# Patient Record
Sex: Female | Born: 1950
Health system: Southern US, Community
[De-identification: ages and names within clinical notes are randomized; demographics above are authoritative.]

## PROBLEM LIST (undated history)

## (undated) DIAGNOSIS — D219 Benign neoplasm of connective and other soft tissue, unspecified: Secondary | ICD-10-CM

## (undated) DIAGNOSIS — M1990 Unspecified osteoarthritis, unspecified site: Secondary | ICD-10-CM

## (undated) DIAGNOSIS — R112 Nausea with vomiting, unspecified: Secondary | ICD-10-CM

## (undated) DIAGNOSIS — E785 Hyperlipidemia, unspecified: Secondary | ICD-10-CM

## (undated) DIAGNOSIS — L719 Rosacea, unspecified: Secondary | ICD-10-CM

## (undated) DIAGNOSIS — Z9189 Other specified personal risk factors, not elsewhere classified: Secondary | ICD-10-CM

## (undated) DIAGNOSIS — K219 Gastro-esophageal reflux disease without esophagitis: Secondary | ICD-10-CM

## (undated) DIAGNOSIS — I809 Phlebitis and thrombophlebitis of unspecified site: Secondary | ICD-10-CM

## (undated) DIAGNOSIS — E041 Nontoxic single thyroid nodule: Secondary | ICD-10-CM

## (undated) DIAGNOSIS — Z9889 Other specified postprocedural states: Secondary | ICD-10-CM

## (undated) HISTORY — DX: Nontoxic single thyroid nodule: E04.1

## (undated) HISTORY — DX: Phlebitis and thrombophlebitis of unspecified site: I80.9

## (undated) HISTORY — DX: Gastro-esophageal reflux disease without esophagitis: K21.9

## (undated) HISTORY — PX: BREAST EXCISIONAL BIOPSY: SUR124

## (undated) HISTORY — DX: Hyperlipidemia, unspecified: E78.5

## (undated) HISTORY — PX: TONSILLECTOMY AND ADENOIDECTOMY: SUR1326

## (undated) HISTORY — PX: FOOT SURGERY: SHX648

## (undated) HISTORY — DX: Other specified personal risk factors, not elsewhere classified: Z91.89

## (undated) HISTORY — DX: Benign neoplasm of connective and other soft tissue, unspecified: D21.9

## (undated) HISTORY — DX: Rosacea, unspecified: L71.9

---

## 1992-02-20 HISTORY — PX: DILATION AND CURETTAGE OF UTERUS: SHX78

## 1998-09-22 ENCOUNTER — Other Ambulatory Visit: Admission: RE | Admit: 1998-09-22 | Discharge: 1998-09-22 | Payer: Self-pay | Admitting: Obstetrics and Gynecology

## 1999-10-17 ENCOUNTER — Other Ambulatory Visit: Admission: RE | Admit: 1999-10-17 | Discharge: 1999-10-17 | Payer: Self-pay | Admitting: Radiology

## 1999-10-27 ENCOUNTER — Other Ambulatory Visit: Admission: RE | Admit: 1999-10-27 | Discharge: 1999-10-27 | Payer: Self-pay | Admitting: Obstetrics and Gynecology

## 2000-09-06 ENCOUNTER — Other Ambulatory Visit: Admission: RE | Admit: 2000-09-06 | Discharge: 2000-09-06 | Payer: Self-pay | Admitting: Obstetrics and Gynecology

## 2000-11-19 DIAGNOSIS — I809 Phlebitis and thrombophlebitis of unspecified site: Secondary | ICD-10-CM

## 2000-11-19 HISTORY — DX: Phlebitis and thrombophlebitis of unspecified site: I80.9

## 2000-12-18 ENCOUNTER — Ambulatory Visit (HOSPITAL_COMMUNITY): Admission: RE | Admit: 2000-12-18 | Discharge: 2000-12-18 | Payer: Self-pay | Admitting: Family Medicine

## 2001-09-11 ENCOUNTER — Other Ambulatory Visit: Admission: RE | Admit: 2001-09-11 | Discharge: 2001-09-11 | Payer: Self-pay | Admitting: Obstetrics and Gynecology

## 2002-01-12 ENCOUNTER — Encounter (INDEPENDENT_AMBULATORY_CARE_PROVIDER_SITE_OTHER): Payer: Self-pay | Admitting: Specialist

## 2002-01-12 ENCOUNTER — Ambulatory Visit (HOSPITAL_COMMUNITY): Admission: RE | Admit: 2002-01-12 | Discharge: 2002-01-12 | Payer: Self-pay | Admitting: Obstetrics and Gynecology

## 2002-03-12 ENCOUNTER — Ambulatory Visit (HOSPITAL_COMMUNITY): Admission: RE | Admit: 2002-03-12 | Discharge: 2002-03-12 | Payer: Self-pay | Admitting: Gastroenterology

## 2002-09-20 DIAGNOSIS — E041 Nontoxic single thyroid nodule: Secondary | ICD-10-CM

## 2002-09-20 HISTORY — DX: Nontoxic single thyroid nodule: E04.1

## 2002-09-24 ENCOUNTER — Other Ambulatory Visit: Admission: RE | Admit: 2002-09-24 | Discharge: 2002-09-24 | Payer: Self-pay | Admitting: Obstetrics and Gynecology

## 2002-12-02 ENCOUNTER — Other Ambulatory Visit: Admission: RE | Admit: 2002-12-02 | Discharge: 2002-12-02 | Payer: Self-pay | Admitting: Diagnostic Radiology

## 2003-02-20 HISTORY — PX: THYROID LOBECTOMY: SHX420

## 2003-03-05 ENCOUNTER — Observation Stay (HOSPITAL_COMMUNITY): Admission: RE | Admit: 2003-03-05 | Discharge: 2003-03-06 | Payer: Self-pay | Admitting: Surgery

## 2003-03-05 ENCOUNTER — Encounter (INDEPENDENT_AMBULATORY_CARE_PROVIDER_SITE_OTHER): Payer: Self-pay | Admitting: *Deleted

## 2003-04-29 ENCOUNTER — Other Ambulatory Visit: Admission: RE | Admit: 2003-04-29 | Discharge: 2003-04-29 | Payer: Self-pay | Admitting: Obstetrics and Gynecology

## 2003-10-04 ENCOUNTER — Other Ambulatory Visit: Admission: RE | Admit: 2003-10-04 | Discharge: 2003-10-04 | Payer: Self-pay | Admitting: Obstetrics and Gynecology

## 2004-04-17 ENCOUNTER — Other Ambulatory Visit: Admission: RE | Admit: 2004-04-17 | Discharge: 2004-04-17 | Payer: Self-pay | Admitting: Obstetrics and Gynecology

## 2004-11-20 ENCOUNTER — Other Ambulatory Visit: Admission: RE | Admit: 2004-11-20 | Discharge: 2004-11-20 | Payer: Self-pay | Admitting: Obstetrics and Gynecology

## 2005-11-23 ENCOUNTER — Other Ambulatory Visit: Admission: RE | Admit: 2005-11-23 | Discharge: 2005-11-23 | Payer: Self-pay | Admitting: Obstetrics and Gynecology

## 2006-01-16 ENCOUNTER — Encounter: Admission: RE | Admit: 2006-01-16 | Discharge: 2006-01-16 | Payer: Self-pay | Admitting: Family Medicine

## 2006-02-11 ENCOUNTER — Encounter: Admission: RE | Admit: 2006-02-11 | Discharge: 2006-02-11 | Payer: Self-pay | Admitting: Family Medicine

## 2006-03-27 ENCOUNTER — Encounter: Admission: RE | Admit: 2006-03-27 | Discharge: 2006-03-27 | Payer: Self-pay | Admitting: Family Medicine

## 2006-05-02 ENCOUNTER — Ambulatory Visit (HOSPITAL_BASED_OUTPATIENT_CLINIC_OR_DEPARTMENT_OTHER): Admission: RE | Admit: 2006-05-02 | Discharge: 2006-05-02 | Payer: Self-pay | Admitting: Orthopedic Surgery

## 2006-12-23 ENCOUNTER — Encounter: Admission: RE | Admit: 2006-12-23 | Discharge: 2006-12-23 | Payer: Self-pay | Admitting: Family Medicine

## 2007-01-01 ENCOUNTER — Encounter: Admission: RE | Admit: 2007-01-01 | Discharge: 2007-01-01 | Payer: Self-pay | Admitting: Family Medicine

## 2007-01-07 ENCOUNTER — Ambulatory Visit: Payer: Self-pay | Admitting: Vascular Surgery

## 2007-01-14 ENCOUNTER — Other Ambulatory Visit: Admission: RE | Admit: 2007-01-14 | Discharge: 2007-01-14 | Payer: Self-pay | Admitting: Obstetrics and Gynecology

## 2007-04-14 ENCOUNTER — Ambulatory Visit: Payer: Self-pay | Admitting: Vascular Surgery

## 2007-04-22 ENCOUNTER — Ambulatory Visit: Payer: Self-pay | Admitting: Vascular Surgery

## 2007-05-26 ENCOUNTER — Ambulatory Visit: Payer: Self-pay | Admitting: Vascular Surgery

## 2007-06-03 ENCOUNTER — Ambulatory Visit: Payer: Self-pay | Admitting: Vascular Surgery

## 2007-06-05 ENCOUNTER — Other Ambulatory Visit: Admission: RE | Admit: 2007-06-05 | Discharge: 2007-06-05 | Payer: Self-pay | Admitting: Obstetrics and Gynecology

## 2007-06-24 ENCOUNTER — Encounter: Admission: RE | Admit: 2007-06-24 | Discharge: 2007-06-24 | Payer: Self-pay | Admitting: Family Medicine

## 2007-07-09 ENCOUNTER — Encounter: Admission: RE | Admit: 2007-07-09 | Discharge: 2007-07-09 | Payer: Self-pay | Admitting: Obstetrics and Gynecology

## 2007-09-16 ENCOUNTER — Other Ambulatory Visit: Admission: RE | Admit: 2007-09-16 | Discharge: 2007-09-16 | Payer: Self-pay | Admitting: Obstetrics and Gynecology

## 2007-12-16 ENCOUNTER — Ambulatory Visit: Payer: Self-pay | Admitting: Vascular Surgery

## 2008-01-14 ENCOUNTER — Encounter: Admission: RE | Admit: 2008-01-14 | Discharge: 2008-01-14 | Payer: Self-pay | Admitting: Obstetrics and Gynecology

## 2008-01-21 ENCOUNTER — Other Ambulatory Visit: Admission: RE | Admit: 2008-01-21 | Discharge: 2008-01-21 | Payer: Self-pay | Admitting: Obstetrics and Gynecology

## 2008-01-29 ENCOUNTER — Encounter: Admission: RE | Admit: 2008-01-29 | Discharge: 2008-01-29 | Payer: Self-pay | Admitting: Obstetrics and Gynecology

## 2008-08-09 ENCOUNTER — Encounter: Admission: RE | Admit: 2008-08-09 | Discharge: 2008-08-09 | Payer: Self-pay | Admitting: Obstetrics and Gynecology

## 2009-01-14 ENCOUNTER — Encounter: Admission: RE | Admit: 2009-01-14 | Discharge: 2009-01-14 | Payer: Self-pay | Admitting: Obstetrics and Gynecology

## 2010-01-16 ENCOUNTER — Encounter: Admission: RE | Admit: 2010-01-16 | Discharge: 2010-01-16 | Payer: Self-pay | Admitting: Family Medicine

## 2010-02-19 HISTORY — PX: KNEE SURGERY: SHX244

## 2010-03-12 ENCOUNTER — Encounter: Payer: Self-pay | Admitting: Family Medicine

## 2010-07-04 NOTE — Assessment & Plan Note (Signed)
OFFICE VISIT   VONA, Christie Beasley  DOB:  1950-04-27                                       12/16/2007  [id number]#:[id number]   The patient is being seen today for a 61-month follow up regarding her  bilateral great saphenous vein ablations with multiple stab  phlebectomies for painful varicosities.  The right side was done  initially in February, followed by the left side in April 2009.  She had  1880 joules of energy on the right side and 2115 joules of energy on the  left.  She, at this point, is not having any specific symptoms related  to her left thigh or calf.  She is very pleased with her result.  The  painful varicosities are absent and she is having no distal edema.  She  has not worn elastic compression stockings on a regular basis and states  that her preoperative symptoms have been relieved at the present time.   PHYSICAL EXAM:  Blood pressure 140/85, heart rate 64, respirations 14.  She has excellent femoral, popliteal, and dorsalis pedis pulses  bilaterally.  There is no distal edema.  There is no evidence of any  residual varicosities.  No hyperpigmentation.  No ulceration.  No  evidence of ischemia.   Venous duplex exam was performed today and, interestingly, the right  greater saphenous vein is closed up to the saphenofemoral junction, but  the left great saphenous vein is widely patent with reflux at the left  saphenofemoral junction and throughout the left greater saphenous vein.  We reviewed the photographs from the 1-week post ablation venous duplex,  which shows clearly that the vein was noncompressible at that time on  the left side, so it can certainly be cannulized throughout.  Currently,  it is asymptomatic and there are no recurrent varicosities.  She may  well require reclosure of the left greater saphenous vein in the future  if she should develop more symptoms or recurrent varicosities.  She will  watch this closely and, if this  occurs, get back in touch with [place].  Otherwise, return to see [place] on a p.r.n. basis.   Quita Skye Hart [place], M.D.  Electronically Signed   JDL/MEDQ  D:  12/16/2007  T:  12/17/2007  Job:  [id number]

## 2010-07-04 NOTE — Consult Note (Signed)
VASCULAR SURGERY CONSULTATION   Christie Beasley, Christie Beasley  DOB:  03/31/1950                                       01/07/2007  [id number]#:[id number]   This is a second opinion.   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  She notices significant throbbing, aching, burning, and  itching in both legs particularly in the thigh and calf regions which  progress as the day progresses, and she is on her feet most of the day  teaching.  She has had no history of venostasis ulcers or bleeding but  does continue to have severe symptoms despite wearing graduated  compression stockings (long leg) since October 08, 2006, when she was  initially evaluated by Dr. Ardyth Gal.  Her symptoms have continued.  She does have some improvement with elevation of the legs which she is  unable to do during the day as she teaches.  Her symptoms are affecting  her daily living both at home and at work, and this is not improved with  the conservative measures which have included graduated compression  stockings as well as analgesics.   PAST MEDICAL HISTORY:  Negative for diabetes, hypertension, coronary  artery disease, COPD, or stroke.  She does have a history of thyroid  disease, has had thyroid surgery, and is on Synthroid.   PREVIOUS SURGERY:  Thyroid resection, breast biopsies, cesarean section  x2.   FAMILY HISTORY:  Positive for coronary artery disease in her mother,  stroke in her father, and negative for diabetes.   SOCIAL HISTORY:  She is single and has 2 children.  Works as a Runner, broadcasting/film/video  at [employer].  She does not use tobacco on a regular basis.  Drinks occasional alcohol.   REVIEW OF SYSTEMS:  Please see health history form.   MEDICATIONS:  Please see health history form.   PHYSICAL EXAMINATION:  VITAL SIGNS:  Blood pressure 128/78, heart rate  64, respirations 18.  GENERAL:   She is a healthy-appearing, middle-aged female in no apparent  distress.  She is alert and oriented x3.  NECK:  Supple.  3+ carotid pulses palpable.  No bruits are audible.  No  palpable adenopathy in the neck.  NEUROLOGIC:  Normal.  CHEST:  Clear to auscultation.  CARDIOVASCULAR:  Regular rhythm with no murmurs.  ABDOMEN:  Soft and nontender with no palpable masses.  EXTREMITIES:  Upper extremity pulses 3+ bilaterally.  Lower extremity  exam reveals 3+ femoral, popliteal, and dorsalis pedis pulses palpable.  She has severe greater saphenous varicosities bilaterally with the right  worse than the left in the distal thigh and medial calf areas with mild  edema distally.  She has some spider and reticular veins near the ankles  bilaterally.  She has some mild venostasis dermatitis bilaterally.  There is no active ulcers.   Venous duplex exam performed at [facility] revealed reflux in both  greater saphenous veins from the saphenofemoral junction distally to the  knee as well as reflux in both short saphenous veins.   I feel that this patient is having significant symptomatology and would  benefit from laser ablation of her right greater saphenous vein with  multiple stab phlebectomies performed at the same setting.  She should  also have laser ablation  of her left greater saphenous system with stab  phlebectomies at a later date to relieve the significant symptoms that  she is experiencing which affect her daily living and her ability to  work.   Quita Skye Hart [place], M.D.  Electronically Signed  JDL/MEDQ  D:  01/07/2007  T:  01/08/2007  Job:  [id number]   cc:   Fayne Norrie, M.D.  Quita Skye Artis Flock, M.D.

## 2010-07-04 NOTE — Assessment & Plan Note (Signed)
OFFICE VISIT   Christie Beasley, Christie Beasley  DOB:  July 23, 1950                                       06/03/2007  EAVWU#:[id number]   The patient underwent laser ablation of left greater saphenous vein with  multiple stab phlebectomies on April 6.  She has had some mild  discomfort in the distal thigh where the injury site was located for the  saphenous vein ablation, and some mild tenderness along the course of  the greater saphenous vein as one would expect, but that is resolving  rapidly.  She has had no distal edema or discomfort in the left calf, or  discomfort related to the stab phlebectomy wounds.   EXAM:  Blood pressure 127/79, heart rate is 55.  Her ankle is not  swollen on the left side and the stab phlebectomy wounds are all healing  well.  There is a moderate area of ecchymosis in the distal thigh  medially over the greater saphenous vein.  There is some mild tenderness  to palpation.  I performed a limited venous duplex exam today and the  saphenous vein is totally occluded from 2 cm from the saphenofemoral  junction to the knee.  Deep venous system is widely patent with normal-  appearing flow.  She is reassured regarding these findings.  She will  schedule some sclerotherapy in the near future with Marisue Ivan, and will return  in 6 months for final followup of bilateral venous reflux study at the  time of her return visit.   Quita Skye Hart [place], M.D.  Electronically Signed   JDL/MEDQ  D:  06/03/2007  T:  06/04/2007  Job:  [id number]

## 2010-07-04 NOTE — Procedures (Signed)
DUPLEX DEEP VENOUS EXAM - LOWER EXTREMITY   INDICATION:  Follow up bilateral greater saphenous vein ablation.   HISTORY:  Edema:  No.  Trauma/Surgery:  Yes.  Pain:  No.  PE:  No.  Previous DVT:  No.  Anticoagulants:  Other:   DUPLEX EXAM:                CFV   SFV   PopV  PTV    GSV                R  L  R  L  R  L  R   L  R  L  Thrombosis    o  o  o  o  o  o  o   o  +  Spontaneous   +  +  +  +  +  +  +   +  +  +  Phasic        +  +  +  +  +  +  +   +  +  +  Augmentation  +  +  +  +  +  +  +   +  +  +  Compressible  +  +  +  +  +  +  +   +  +  +  Competent     +  +  +  +  +  +  +   +  +  +   Legend:  + - yes  o - no  p - partial  D - decreased   IMPRESSION:  No evidence of bilateral lower extremity deep venous  thrombosis.  The left greater saphenous vein appears patent with reflux from SFJ to  distal thigh.  The right greater saphenous vein appears ablated from proximal thigh to  distal thigh.   _____________________________  Quita Skye. Hart [place], M.D.   MG/MEDQ  D:  12/16/2007  T:  12/16/2007  Job:  [id number]

## 2010-07-04 NOTE — Assessment & Plan Note (Signed)
OFFICE VISIT   TIANNE, PLOTT  DOB:  1950-04-23                                       04/22/2007  EAVWU#:[id number]   Patient returns one week post laser ablation of her right greater  saphenous vein with multiple stab phlebectomies in the right calf and  thigh.  She has had mild-to-moderate discomfort along the course of the  greater saphenous vein in the thigh, where the ablation was performed,  and some mild discomfort in the medial calf.  She has had some bruising  in the distal thigh, which has not been severe, and her pain has  improved each day.  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  She has had no distal edema.   PHYSICAL EXAMINATION:  Blood pressure 139/86, heart rate 76.  The small  area of ecchymosis in the distal medial thigh is mildly tender.  There  is no distal edema noted.  Stab phlebectomy wounds are healing well.   I performed a venous duplex exam today, and there is total occlusion of  the saphenous vein from 1 cm from the saphenofemoral junction to the  knee with a totally noncompressible vein and no flow visible.  The deep  venous system appears widely patent with no thrombus in the common  femoral vein and normal flow.   She was reassured regarding these findings and will be scheduled for a  similar procedure on her left leg in the near future.   Quita Skye Hart [place], M.D.  Electronically Signed   JDL/MEDQ  D:  04/22/2007  T:  04/23/2007  Job:  [phone]

## 2010-07-07 NOTE — Op Note (Signed)
NAME:  Christie Beasley, Christie Beasley                        ACCOUNT NO.:  [id number]   MEDICAL RECORD NO.:  [id number]                   PATIENT TYPE:  AMB   LOCATION:  ENDO                                 FACILITY:  [facility]   PHYSICIAN:  Anselmo Rod, M.D.               DATE OF BIRTH:  [birth year]   DATE OF PROCEDURE:  03/12/2002  DATE OF DISCHARGE:                                 OPERATIVE REPORT   PROCEDURE PERFORMED:  Screening colonoscopy.   ENDOSCOPIST:  Anselmo Rod, M.D.   INSTRUMENT USED:  Olympus video colonoscope.   INDICATIONS FOR PROCEDURE:  A 60 year old white female who underwent  screening colonoscopy to rule out colonic polyps, masses, etc.   PREPROCEDURE PREPARATION:  Informed consent was procured from the patient.  The patient had fasted for eight hours prior to the procedure and prepped  with a bottle of magnesium citrate and a bottle of MiraLax the night prior  to the procedure.   PREPROCEDURE PHYSICAL:  VITAL SIGNS:  The patient has stable vital signs.  NECK:  Supple.  CHEST:  Clear to auscultation, S1, S2 regular.  ABDOMEN:  Soft with normal bowel sounds.   DESCRIPTION OF PROCEDURE:  The patient was placed in the left lateral  decubitus position and sedated with 100 mg of Demerol and 10 mg of Versed  intravenously.  Once the patient was adequately sedated and maintained on  low-flow oxygen and continuous cardiac monitoring, the Olympus video  colonoscope was advanced from the rectum to the cecum and terminal ileum  with extreme difficulty.  The patient had an atonic colon.  The colon was  very tortuous.  The patient's position had to be changed from the left  lateral position to the right lateral position on several occasions with the  application of abdominal pressure to facilitate advancement of the scope up  to the cecum.  The appendiceal orifice and ileocecal valve were visualized  and photographed.  The terminal ileum appeared normal and without lesions.  No masses, polyps, diverticula, erosions, or ulcerations were seen.  Retroflexion revealed no acute abnormalities.   IMPRESSION:  1. Healthy-appearing colonic mucosa and terminal ileum.  2. Very tortuous colon and terminal ileum.  3. Prolonged procedure secondary to atonic colon.   RECOMMENDATIONS:  1. High-fiber diet with liberal fluid intake has been recommended.  2.     Repeat CRC screening is recommended in the next 10 years unless the patient      develops any abnormal symptoms in the interim.  3. Outpatient follow-up on an outpatient basis.                                                Anselmo Rod, M.D.    JNM/MEDQ  D:  03/12/2002  T:  03/12/2002  Job:  [id number]   cc:   Edwena Felty. Romine, M.D.  [address]  [place]  [place] [address]  Fax: [phone]

## 2010-07-07 NOTE — Op Note (Signed)
Christie Beasley, Christie Beasley              ACCOUNT NO.:  [id number]   MEDICAL RECORD NO.:  [id number]          PATIENT TYPE:  AMB   LOCATION:  DSC                          FACILITY:  [facility]   PHYSICIAN:  Rodney A. Mortenson, M.D.DATE OF BIRTH:  02-Oct-1950   DATE OF PROCEDURE:  05/02/2006  DATE OF DISCHARGE:                               OPERATIVE REPORT   JUSTIFICATION:  A 60 year old female with a 2-year history of bilateral  knee pain worse on the right than on the left.  She has crunching in her  knees.  There is no joint line tenderness but the patella in the  intercondylar notch is very uncomfortable.  Standard weightbearing films  show patellofemoral arthritic changes, greater on the right than on the  left.  There are large marginal osteophytes about the border of the  patella and some lateral tracking.  The joint space about the lateral  patellofemoral joint is narrowed.  There is some early osteoarthritis of  the medial compartment on weightbearing films.  Because of persistent  pain and discomfort, it is felt that arthroscopic evaluation and  treatment is indicated.  The case was reviewed with Dr. Priscille Kluver.  It was  felt that since the lateral tracking that a lateral release and  debridement of the osteophytes off the lateral patellar facet made help  decompress the lateral patellar facet and hopefully give some pain  relief.  The patient clearly understands the intent of this operation.  No guarantees can be given and she clearly understands.  Questions were  answered and encouraged.  Complications were discussed preoperatively.  At the time of surgery her other pathology will be visualized and  addressed.   JUSTIFICATION FOR OUTPATIENT SURGERY:  Minimal morbidity.   PREOPERATIVE DIAGNOSES:  1. 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  2. Osteophytes, lateral patellar facet.  3. Small radial tear at the medial meniscus, right knee.   POSTOPERATIVE DIAGNOSES:  1.  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  2. Osteophytes, lateral patellar facet.  3. Small radial tear at the medial meniscus, right knee.   OPERATION:  1. Arthroscopy.  2. Debridement at the leading edge of the medial meniscus, right knee.  3. Removal of osteophytes over the lateral border, lateral patellar      facet.  4. Lateral release, right knee.   SURGEON:  Lenard Galloway. Chaney Malling, M.D.   ANESTHESIA:  MAC.   PATHOLOGY:  With the arthroscope in the knee, a very careful examination  of the knee was undertaken.  The patellofemoral joint was visualized  first.  There was a large about the lateral patellar facet that has  absolutely no articular cartilage and there is a line of large  osteophytes about the lateral patellar border.  The patella tilts  laterally.  The medial side of the patella appears fairly normal.  The  ACL was normal.  In the medial compartment there was fairly normal  articular cartilage over the medial femoral condyle and medial tibial  plateau, although the be somewhat thinned, and there is a small radial  tear in the mid third of  the medial meniscus.  In the lateral  compartment, articular cartilage about the weightbearing area of the  lateral femoral condyle and lateral tibial plateau was normal, as is the  entire lateral meniscus.  The trochlear area shows some chondromalacia  of the patella.   PROCEDURE:  The patient was placed on the operating table in supine  position with a pneumatic tourniquet about the right upper thigh.  The  right leg was placed in a leg holder and the entire right lower  extremity prepped with DuraPrep, draped out in the usual manner.  An  infusion cannula was placed in the superior medial pouch and the knee  distended with saline.  Anteromedial and anterolateral portals were made  and the arthroscope was introduced.  Attention was first turned to the  medial compartment.  There was some fraying and tearing of the  leading  edge the mid third of the medial meniscus and it was debrided with intra-  articular shaver.  The rest of the meniscus appeared fairly normal and  stable.  There is some early loss of articular cartilage over the  weightbearing area of the medial femoral condyle.  Again the lateral  compartment appeared fairly normal.  The trochlear area had some  chondromalacia and the patella showed total loss of articular cartilage  over the lateral patellar facet and a line of osteophytes laterally.  Through the lateral port a 4-mm bur was inserted and the osteophytes  along the lateral border of the patella were debrided back.  Once this  was accomplished to my satisfaction, a lateral release was done to allow  a patella to shift more to the midline and unload the lateral patellar  facet.  Excellent decompression of the lateral retinacular structures  was achieved, allowing the patella to track more to the midline.  Bleeders were coagulated.  The knee was put through a full range of  motion.  Marcaine was then placed in the knee and a large bulky pressure  dressing applied and the patient returned to the recovery room in  excellent condition.  Technically this went extremely well.   FOLLOW-UP CARE:  1. Percocet for pain.  2. Usual postoperative instructions given.  3. To my office on Wednesday.  4. In addition, in the future this patient will be a total knee      candidate but, hopefully, this will give her more mileage in the      short term and that was our objective.           ______________________________  Lenard Galloway. Chaney Malling, M.D.     RAM/MEDQ  D:  05/02/2006  T:  05/03/2006  Job:  [id number]

## 2010-07-07 NOTE — Op Note (Signed)
   NAME:  Christie Beasley, Christie Beasley                        ACCOUNT NO.:  [id number]   MEDICAL RECORD NO.:  [id number]                   PATIENT TYPE:  AMB   LOCATION:  SDC                                  FACILITY:  [facility]   PHYSICIAN:  Cynthia P. Romine, M.D.             DATE OF BIRTH:  [birth year]   DATE OF PROCEDURE:  01/12/2002  DATE OF DISCHARGE:                                 OPERATIVE REPORT   PREOPERATIVE DIAGNOSES:  Postcoital, postmenopausal bleeding.   POSTOPERATIVE DIAGNOSES:  Postcoital, postmenopausal bleeding, pathology  pending.   PROCEDURE:  1. Hysteroscopy.  2. Dilatation and curettage.   SURGEON:  Cynthia P. Romine, M.D.   ANESTHESIA:  General by LMA.   ESTIMATED BLOOD LOSS:  50 cc.   COMPLICATIONS:  None.   PROCEDURE:  The patient was taken to the operating room and after the  induction of adequate general anesthesia by LMA was placed in a dorsal  lithotomy position and prepped and draped in the usual fashion.  The cervix  was grasped on its anterior lip with a single tooth tenaculum and dilated to  a number 27 Pratt.  The uterus had previously been sounded to 8 cm.  The  diagnostic hysteroscope was introduced.  Sorbitol was used as a distention  medium at a pressure of 80 mmHg.  The endometrial cavity did appear clean.  The tubal ostia were noted.  No abnormalities were seen with the  hysteroscope.  The hysteroscope was then removed.  A gentle sharp curettage  was then carried out.  The hysteroscope was reintroduced.  There was a loose  piece of tissue noted in the endometrium.  This was grasped with a polyp  forceps.  Finally, it was able to be removed.  Specimen was sent to  pathology.  The instruments were removed from the vagina.  The patient  tolerated it well.  Went in satisfactory condition to postanesthesia  recovery.  The sorbitol deficit was 80 cc.  There were no complications.                                               Cynthia P. Romine, M.D.    CPR/MEDQ  D:  01/12/2002  T:  01/12/2002  Job:  [id number]

## 2010-07-07 NOTE — Op Note (Signed)
NAME:  Christie Beasley, Christie Beasley                        ACCOUNT NO.:  [id number]   MEDICAL RECORD NO.:  [id number]                   PATIENT TYPE:  OBV   LOCATION:  0447                                 FACILITY:  [facility]   PHYSICIAN:  Velora Heckler, M.D.                DATE OF BIRTH:  May 22, 1950   DATE OF PROCEDURE:  03/05/2003  DATE OF DISCHARGE:                                 OPERATIVE REPORT   PREOPERATIVE DIAGNOSIS:  Right thyroid nodule with Hurthle cell change.   POSTOPERATIVE DIAGNOSIS:  Right thyroid nodule with Hurthle cell change.   PROCEDURE:  Right thyroid lobectomy.   SURGEON:  Velora Heckler, M.D.   ASSISTANT:  Jerelene Redden, M.D.   ANESTHESIA:  General.   ESTIMATED BLOOD LOSS:  Minimal.   PREPARATION:  Betadine.   COMPLICATIONS:  None.   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 Ardyth Harps for thyroid nodule.  The patient had  been noted on routine physical exam by her gynecologist to have a right-  sided thyroid nodule.  Ultrasound showed multiple nodules with a dominant  nodule in the right lobe.  Fine needle aspiration was performed.  It showed  Hurthle cell change.  The patient now comes to surgery for a resection.   DESCRIPTION OF PROCEDURE:  The procedure is done in OR #6 at the [facility].  The patient is brought to the operating room and placed  in a supine position on the operating room table.  Following the  administration of general anesthesia, the patient is prepped and draped in  the usual strict aseptic fashion.  After ascertaining that an adequate level  of anesthesia had been obtained, a Kocher incision is made with a #15 blade.  Dissection is carried down through the subcutaneous tissues and platysma.  Hemostasis is obtained with the electrocautery.  Skin flaps are developed  cephalad and caudad from the thyroid notch.  A Mahorner self-retaining  retractor is placed for  exposure.  Strap muscles are incised in the midline.  External jugular veins are ligated with 2-0 silk ties.  Strap muscles are  reflected laterally, and dissection is begun on the right side.  Right  thyroid lobe is carefully dissected out.  Middle thyroid vein is divided  between medium Ligaclips.  The inferior thyroid veins are divided between  medium Ligaclips.  Superior pole is carefully dissected out.  The superior  pole vessels are isolated, ligated in continuity with 2-0 silk ties and  medium Ligaclips and divided.  Gland is rolled anteriorly.  Superior  parathyroid gland is identified.  It is dissected off of the thyroid capsule  and preserved.  Inferior parathyroid gland is likewise identified, dissected  off the thyroid capsule, and preserved.  Branches of the inferior thyroid  artery are divided between small Ligaclips.  Recurrent laryngeal nerve is  identified and  preserved.  Ligament of Allyson Sabal is carefully transected with  the electrocautery, and the gland is rolled further medially.  It is rolled  up and onto the anterior surface of the trachea.  Using the electrocautery,  the isthmus is mobilized across the midline.  The left side of the isthmus  is then transected between hemostats and ligated with 30 Vicryl suture  ligatures.  Specimen is submitted to pathology for frozen section.  Dr. Jimmy Picket notes a dominant nodule with Hurthle cell change.  There is minimal  nuclear atypia.  There is no sign of papillary cancer.  Good hemostasis is  obtained in the right neck after irrigation.  Surgicel is placed over the  area of the recurrent laryngeal nerve and parathyroid glands.  Left lobe is  palpated and shows no dominant or discrete mass.  It has a normal  appearance.  It is relatively small.  Strap muscles are reapproximated in  the midline with interrupted 3-0 Vicryl sutures.  Platysma is closed with  interrupted 3-0 Vicryl sutures.  Skin edges are reapproximated with a   running 40 Vicryl subcuticular suture.  Wound is washed and dried, and  Benzoin and Steri-Strips are applied.  Sterile gauze dressings are applied.  The patient is awakened from anesthesia and brought to the recovery room in  stable condition.  The patient tolerated the procedure well.                                               Velora Heckler, M.D.    TMG/MEDQ  D:  03/05/2003  T:  03/05/2003  Job:  [id number]   cc:   Jeannett Senior A. Evlyn Kanner, M.D.  [address]  [place]  [place] [address]  Fax: [phone]   Vale Haven. Andrey Campanile, M.D.  [address]  [place]  [place] [address]  Fax: [phone]   Laqueta Linden, M.D.  [address]  [place]  [place] [address]  Fax: [phone]

## 2010-12-12 ENCOUNTER — Other Ambulatory Visit: Payer: Self-pay | Admitting: Family Medicine

## 2010-12-12 DIAGNOSIS — Z1231 Encounter for screening mammogram for malignant neoplasm of breast: Secondary | ICD-10-CM

## 2011-01-18 ENCOUNTER — Ambulatory Visit
Admission: RE | Admit: 2011-01-18 | Discharge: 2011-01-18 | Disposition: A | Discharge status: Home / Self Care | Payer: BC Managed Care – PPO | Source: Ambulatory Visit | Attending: Family Medicine | Admitting: Family Medicine

## 2011-01-18 DIAGNOSIS — Z1231 Encounter for screening mammogram for malignant neoplasm of breast: Secondary | ICD-10-CM

## 2011-12-10 ENCOUNTER — Other Ambulatory Visit: Payer: Self-pay | Admitting: Family Medicine

## 2011-12-10 DIAGNOSIS — Z1231 Encounter for screening mammogram for malignant neoplasm of breast: Secondary | ICD-10-CM

## 2012-01-11 HISTORY — PX: HYSTEROSCOPY: SHX211

## 2012-01-21 ENCOUNTER — Ambulatory Visit
Admission: RE | Admit: 2012-01-21 | Discharge: 2012-01-21 | Disposition: A | Discharge status: Home / Self Care | Payer: BC Managed Care – PPO | Source: Ambulatory Visit | Attending: Family Medicine | Admitting: Family Medicine

## 2012-01-21 DIAGNOSIS — Z1231 Encounter for screening mammogram for malignant neoplasm of breast: Secondary | ICD-10-CM

## 2012-11-10 ENCOUNTER — Telehealth: Payer: Self-pay | Admitting: Gynecology

## 2012-11-10 NOTE — Telephone Encounter (Signed)
Patient is having  Irregular bleeding after menopause.

## 2012-11-10 NOTE — Telephone Encounter (Signed)
Christie Beasley is a 62 y.o. female Hx Menopause. Had physical today with Dr. Kevan Ny. Advised to see gyn per PCP.  Had bleeding in July. No active bleeding now. Only occurred one time.  Requesting appointment.  Appointment booked with Dr. Farrel Gobble. (okay to use u/s slot per Kennon Rounds) Motrin instructions given.  Advised possible biopsy if MD agrees.  Advised to take Motrin=Advil=Ibuprofen 800 mg (Can purchase over the counter, you will need four 200 mg pills)   Take with food. One hour before appointment.

## 2012-11-11 ENCOUNTER — Ambulatory Visit (INDEPENDENT_AMBULATORY_CARE_PROVIDER_SITE_OTHER): Payer: BC Managed Care – PPO | Admitting: Gynecology

## 2012-11-11 ENCOUNTER — Encounter: Payer: Self-pay | Admitting: Gynecology

## 2012-11-11 VITALS — BP 118/84 | HR 74 | Resp 14 | Ht 62.0 in | Wt 163.0 lb

## 2012-11-11 DIAGNOSIS — N952 Postmenopausal atrophic vaginitis: Secondary | ICD-10-CM

## 2012-11-11 DIAGNOSIS — N950 Postmenopausal bleeding: Secondary | ICD-10-CM

## 2012-11-11 NOTE — Progress Notes (Signed)
62 y.o.Divorced Caucasian female complaining of 1days history of vaginal bleeding. She describes it as spotting and  once.  She  Is currently on no HRT .  She has not had previous episodes of menopausal bleeding. She has been menopausal for  10 years. Associated symptoms are none   Workup to date: none.  Pt states that bleeding occurred on vacation in [place] in July and noticed the blood on underwear after walking all day.  Pt states that the bleeding did not recur.  No associated sexual activitiy.  Pt with known fibroids.     There are no active problems to display for this patient.   Past Medical History  Diagnosis Date  . Thrombophlebitis 11/2000  . Thyroid nodule 09/2002  . Fibroid     Past Surgical History  Procedure Laterality Date  . Tonsillectomy and adenoidectomy    . Breast lumpectomy Bilateral 1978; 1989    Benign  . Dilation and curettage of uterus  1994  . Thyroid lobectomy Right 02/2003  . Knee surgery Left 2012  . Foot surgery Bilateral     Both feet operated on  . Hysteroscopy  01/11/12    D&C    Current Outpatient Prescriptions  Medication Sig Dispense Refill  . aspirin 81 MG tablet Take 81 mg by mouth daily.      . Ibuprofen (ADVIL PO) Take by mouth as needed.      Marland Kitchen MAGNESIUM PO Take by mouth.      . Multiple Vitamins-Minerals (MULTIVITAMIN PO) Take by mouth.      . temazepam (RESTORIL) 15 MG capsule as needed.       Marland Kitchen SYNTHROID 75 MCG tablet daily.       . Vitamin D, Ergocalciferol, (DRISDOL) 50000 UNITS CAPS capsule Patient takes it every other week.       No current facility-administered medications for this visit.    VHQ:IONGEXBMW items are noted in HPI.  Exam:    BP 118/84  Pulse 74  Resp 14  Ht 5\' 2"  (1.575 m)  Wt 163 lb (73.936 kg)  BMI 29.81 kg/m2  LMP 09/12/2012  General appearance: alert, cooperative and appears stated age Abdomen: soft, non-tender; bowel sounds normal; no masses,  no organomegaly no inguinal nodes  palpated  Pelvic: External genitalia:  atrophic appearance              Bartholins and Skenes: normal                 Vagina: atrophic, unable to open peterson speculum at cervix              Cervix: atrophic                      Bimanual Exam:  Uterus:  uterus is normal size, shape, consistency and nontender                                      Adnexa:    no masses                                      Rectovaginal: Deferred  Anus:  defer exam  A:  Post menopausal bleeding  P:  Known fibroid uterus, atrophic vaginitis Will defer biopsy today, rto for pelvic u/s and triage on ems Pt agreeable

## 2012-11-11 NOTE — Patient Instructions (Signed)
Endometrial Biopsy Post- Procedure Instructions  1. You may take Ibuprofen, Aleve or Tylenol for pain if needed.     Cramping should resolve within 24 hours.  2.  You may have a small amount of spotting. You should wear a mini pad for the next few days.  3. You may have intercourse after 24 hours.  4. You need to call if you have any pelvic pain, fever, heavy bleeding or foul smelling     vaginal discharge.  5. Shower or bathe as normal.  6. We will call you within one week with results or we will discuss the results at your follow-up appointment if needed.  

## 2012-11-12 ENCOUNTER — Encounter: Payer: Self-pay | Admitting: Gynecology

## 2012-11-12 DIAGNOSIS — N952 Postmenopausal atrophic vaginitis: Secondary | ICD-10-CM | POA: Insufficient documentation

## 2012-11-12 DIAGNOSIS — N950 Postmenopausal bleeding: Secondary | ICD-10-CM | POA: Insufficient documentation

## 2012-11-18 ENCOUNTER — Ambulatory Visit (INDEPENDENT_AMBULATORY_CARE_PROVIDER_SITE_OTHER): Payer: BC Managed Care – PPO

## 2012-11-18 ENCOUNTER — Ambulatory Visit (INDEPENDENT_AMBULATORY_CARE_PROVIDER_SITE_OTHER): Payer: BC Managed Care – PPO | Admitting: Gynecology

## 2012-11-18 VITALS — BP 100/60 | HR 66 | Resp 14 | Ht 62.0 in | Wt 161.0 lb

## 2012-11-18 DIAGNOSIS — N952 Postmenopausal atrophic vaginitis: Secondary | ICD-10-CM

## 2012-11-18 DIAGNOSIS — N950 Postmenopausal bleeding: Secondary | ICD-10-CM

## 2012-11-18 NOTE — Progress Notes (Signed)
      Images reviewed with pt, pt here for episode of PMB that occurred 80m ago after prolonged walking on vacation, none since.  Pt noted to have a markedly atrophic vagina and speculum placement was an issue so we opted to evaluate lining by u/s first. Uterus remarkable for multiple fibroids (7) less than 2cm, ems echogenic 2mm noted, 9mm follicle noted on right ovary, no free fluid. Pt has never used HRT. We discussed the likelihood of uterine cancer with ems of less than 4mm.  We suggest ok to watch at this point but if she would like to proceed, we should prime cervix with cytotec and she was agreeable.  If she should rebleed, we will attempt the biopsy and she is agreeable.

## 2012-12-25 ENCOUNTER — Other Ambulatory Visit: Payer: Self-pay

## 2012-12-29 ENCOUNTER — Other Ambulatory Visit: Payer: Self-pay

## 2012-12-29 DIAGNOSIS — Z1231 Encounter for screening mammogram for malignant neoplasm of breast: Secondary | ICD-10-CM

## 2013-01-29 ENCOUNTER — Ambulatory Visit
Admission: RE | Admit: 2013-01-29 | Discharge: 2013-01-29 | Disposition: A | Discharge status: Home / Self Care | Payer: BC Managed Care – PPO | Source: Ambulatory Visit

## 2013-01-29 DIAGNOSIS — Z1231 Encounter for screening mammogram for malignant neoplasm of breast: Secondary | ICD-10-CM

## 2013-05-01 ENCOUNTER — Encounter: Payer: Self-pay | Admitting: Gynecology

## 2013-05-01 ENCOUNTER — Ambulatory Visit (INDEPENDENT_AMBULATORY_CARE_PROVIDER_SITE_OTHER): Payer: BC Managed Care – PPO | Admitting: Gynecology

## 2013-05-01 ENCOUNTER — Ambulatory Visit: Payer: Self-pay | Admitting: Obstetrics and Gynecology

## 2013-05-01 VITALS — BP 138/93 | HR 73 | Resp 18 | Ht 62.0 in | Wt 163.0 lb

## 2013-05-01 DIAGNOSIS — M949 Disorder of cartilage, unspecified: Secondary | ICD-10-CM

## 2013-05-01 DIAGNOSIS — Z124 Encounter for screening for malignant neoplasm of cervix: Secondary | ICD-10-CM

## 2013-05-01 DIAGNOSIS — M899 Disorder of bone, unspecified: Secondary | ICD-10-CM

## 2013-05-01 DIAGNOSIS — M8580 Other specified disorders of bone density and structure, unspecified site: Secondary | ICD-10-CM

## 2013-05-01 DIAGNOSIS — N895 Stricture and atresia of vagina: Secondary | ICD-10-CM

## 2013-05-01 DIAGNOSIS — N952 Postmenopausal atrophic vaginitis: Secondary | ICD-10-CM

## 2013-05-01 DIAGNOSIS — Z01419 Encounter for gynecological examination (general) (routine) without abnormal findings: Secondary | ICD-10-CM

## 2013-05-01 DIAGNOSIS — Z0000 Encounter for general adult medical examination without abnormal findings: Secondary | ICD-10-CM

## 2013-05-01 LAB — POCT URINALYSIS DIPSTICK
PH UA: 5
Urobilinogen, UA: NEGATIVE

## 2013-05-01 NOTE — Progress Notes (Signed)
63 y.o. Divorced  Caucasian female   [phone] here for annual exam. Pt reports menses are absent due to Menopause. She does not report post-menopasual bleeding since 6/14 when she spotted on vacation.  Pt is dating but not yet sexually active. .daughter had baby in December.     No LMP recorded. Patient is not currently having periods (Reason: Other).          Sexually active: no  The current method of family planning is post menopausal status.    Exercising: yes  water aerobics 2x/wk Last pap: 04/10/10 Neg Abnormal PAP: yes Mammogram: 02/03/13 bi-Rads 1 BSE: no  Colonoscopy: 03/2013 Normal f/u in 10 years  DEXA: 2009  Alcohol: 2 drinks/wk Tobacco: no  Labs: Darcus Austin, MD ; Urine: Leuks 2  Health Maintenance  Topic Date Due  . Pap Smear  01/06/1967  . Tetanus/tdap  01/06/1968  . Colonoscopy  01/06/1999  . Zostavax  01/05/2009  . Influenza Vaccine  09/19/2012  . Mammogram  01/30/2015    Family History  Problem Relation Age of Onset  . Cancer Father     Esophageal    Patient Active Problem List   Diagnosis Date Noted  . Post-menopausal bleeding 11/12/2012  . Vaginal atrophy 11/12/2012    Past Medical History  Diagnosis Date  . Thrombophlebitis 11/2000  . Thyroid nodule 09/2002  . Fibroid     Past Surgical History  Procedure Laterality Date  . Tonsillectomy and adenoidectomy    . Breast lumpectomy Bilateral 1978; 1989    Benign  . Dilation and curettage of uterus  1994  . Thyroid lobectomy Right 02/2003  . Knee surgery Left 2012  . Foot surgery Bilateral     Both feet operated on  . Hysteroscopy  01/11/12    D&C    Allergies: Review of patient's allergies indicates no known allergies.  Current Outpatient Prescriptions  Medication Sig Dispense Refill  . aspirin 81 MG tablet Take 81 mg by mouth daily.      Marland Kitchen glucosamine-chondroitin 500-400 MG tablet Take 1 tablet by mouth 3 (three) times daily.      . Ibuprofen (ADVIL PO) Take by mouth as needed.      Marland Kitchen  MAGNESIUM PO Take by mouth.      . Multiple Vitamins-Minerals (MULTIVITAMIN PO) Take by mouth.      . SYNTHROID 75 MCG tablet daily.       . temazepam (RESTORIL) 15 MG capsule as needed.       . TURMERIC PO Take by mouth.      . Vitamin D, Ergocalciferol, (DRISDOL) 50000 UNITS CAPS capsule Patient takes it every other week.       No current facility-administered medications for this visit.    ROS: Pertinent items are noted in HPI.  Exam:    BP 138/93  Pulse 73  Resp 18  Ht 5\' 2"  (1.575 m)  Wt 163 lb (73.936 kg)  BMI 29.81 kg/m2 Weight change: @WEIGHTCHANGE @ Last 3 height recordings:  Ht Readings from Last 3 Encounters:  05/01/13 5\' 2"  (1.575 m)  11/18/12 5\' 2"  (1.575 m)  11/11/12 5\' 2"  (1.575 m)   General appearance: alert, cooperative and appears stated age Head: Normocephalic, without obvious abnormality, atraumatic Neck: no adenopathy, no carotid bruit, no JVD, supple, symmetrical, trachea midline and thyroid not enlarged, symmetric, no tenderness/mass/nodules Lungs: clear to auscultation bilaterally Breasts: normal appearance, no masses or tenderness Heart: regular rate and rhythm, S1, S2 normal, no murmur, click, rub or  gallop Abdomen: soft, non-tender; bowel sounds normal; no masses,  no organomegaly Extremities: extremities normal, atraumatic, no cyanosis or edema Skin: Skin color, texture, turgor normal. No rashes or lesions Lymph nodes: Cervical, supraclavicular, and axillary nodes normal. no inguinal nodes palpated Neurologic: Grossly normal   Pelvic: External genitalia:  no lesions              Urethra: normal appearing urethra with no masses, tenderness or lesions              Bartholins and Skenes: normal                 Vagina: atrophic, pale, smooth, narrow introitus cannot tolerate 2 finger on BME, length normal               Cervix: normal appearance              Pap taken: yes pediatric speculum used, difficult to open        Bimanual Exam:  Uterus:   Small, nontender                                      Adnexa:    no masses                                      Rectovaginal: Confirms                                      Anus:  normal sphincter tone, no lesions  A: well woman Atrophic vaginitis osteopenia     P: mammogram pap smear with HRHPV, guidelines reviewed Discussed vaginal estrogen and consider dilators, information given and pt will try to find on line, she will contact us back on ce she gets them and we can start using.  Pt would prefer to not use vaginal estrogen, so we will defer but recommend cocoanut oil for when she becomes active. Pt was shown dilators  DEXA this year- counseled on breast self exam, mammography screening, osteoporosis, adequate intake of calcium and vitamin D, diet and exercise return annually or prn Discussed PAP guideline changes, importance of weight bearing exercises, calcium, vit D and balanced diet.  An After Visit Summary was printed and given to the patient.  Additional 16m spent discussing treatment for narrow vaginal introitus and vaginal atrophy, >50% face to face

## 2013-05-05 NOTE — Addendum Note (Signed)
Addended by: Elveria Rising on: 05/05/2013 11:37 AM   Modules accepted: Orders

## 2013-05-08 LAB — IPS PAP TEST WITH HPV

## 2013-12-21 ENCOUNTER — Encounter: Payer: Self-pay | Admitting: Gynecology

## 2013-12-30 ENCOUNTER — Other Ambulatory Visit: Payer: Self-pay

## 2013-12-30 DIAGNOSIS — Z1231 Encounter for screening mammogram for malignant neoplasm of breast: Secondary | ICD-10-CM

## 2014-01-19 ENCOUNTER — Telehealth: Payer: Self-pay

## 2014-01-19 NOTE — Telephone Encounter (Signed)
lmtcb to reschedule AEX with Dr. Lathrop 

## 2014-02-08 ENCOUNTER — Ambulatory Visit
Admission: RE | Admit: 2014-02-08 | Discharge: 2014-02-08 | Disposition: A | Discharge status: Home / Self Care | Payer: PRIVATE HEALTH INSURANCE | Source: Ambulatory Visit

## 2014-02-08 DIAGNOSIS — Z1231 Encounter for screening mammogram for malignant neoplasm of breast: Secondary | ICD-10-CM

## 2014-05-05 ENCOUNTER — Encounter: Payer: Self-pay | Admitting: Certified Nurse Midwife

## 2014-05-05 ENCOUNTER — Ambulatory Visit (INDEPENDENT_AMBULATORY_CARE_PROVIDER_SITE_OTHER): Payer: Medicare Other | Admitting: Certified Nurse Midwife

## 2014-05-05 ENCOUNTER — Ambulatory Visit: Payer: BC Managed Care – PPO | Admitting: Gynecology

## 2014-05-05 VITALS — BP 138/86 | HR 78 | Resp 14 | Ht 61.75 in | Wt 164.0 lb

## 2014-05-05 DIAGNOSIS — Z0000 Encounter for general adult medical examination without abnormal findings: Secondary | ICD-10-CM

## 2014-05-05 DIAGNOSIS — Z124 Encounter for screening for malignant neoplasm of cervix: Secondary | ICD-10-CM

## 2014-05-05 DIAGNOSIS — Z01419 Encounter for gynecological examination (general) (routine) without abnormal findings: Secondary | ICD-10-CM | POA: Diagnosis not present

## 2014-05-05 LAB — POCT URINALYSIS DIPSTICK
Leukocytes, UA: NEGATIVE
PH UA: 5
Urobilinogen, UA: NEGATIVE

## 2014-05-05 NOTE — Patient Instructions (Signed)

## 2014-05-05 NOTE — Progress Notes (Signed)
64 y.o. [id number] Divorced  Caucasian Fe here for annual exam.  Menopausal no HRT. Denies vaginal bleeding or vaginal dryness. Sees PCP for aex/labs and Hypothyroid management. Taking square dancing lessons!  Patient's last menstrual period was 02/19/2001.          Sexually active: Yes.    The current method of family planning is post menopausal status.    Exercising: Yes.    water aerobics, dancing 1x/wk Smoker:  no  Health Maintenance: Pap:  05/05/13 NEG HR HPV negative  MMG:  02/09/14 Breast density category C; Bi-Rads 1: Negative BSE: yes Colonoscopy: Mid 2015 Normal f/u in 10 years ; January Endoscopy negative BMD:   2011 TDaP:  2014 Labs: Darcus Austin, MD; Urine: Negative    reports that she has never smoked. She does not have any smokeless tobacco history on file. She reports that she drinks alcohol.  Past Medical History  Diagnosis Date  . Thrombophlebitis 11/2000  . Thyroid nodule 09/2002  . Fibroid   . Acid reflux     Past Surgical History  Procedure Laterality Date  . Tonsillectomy and adenoidectomy    . Breast lumpectomy Bilateral 1978; 1989    Benign  . Dilation and curettage of uterus  1994  . Thyroid lobectomy Right 02/2003  . Knee surgery Left 2012  . Foot surgery Bilateral     Both feet operated on  . Hysteroscopy  01/11/12    D&C    Current Outpatient Prescriptions  Medication Sig Dispense Refill  . CALCIUM PO Take by mouth daily.    Marland Kitchen glucosamine-chondroitin 500-400 MG tablet Take 1 tablet by mouth 3 (three) times daily.    . Ibuprofen (ADVIL PO) Take by mouth as needed.    Marland Kitchen MAGNESIUM PO Take by mouth.    . Multiple Vitamins-Minerals (MULTIVITAMIN PO) Take by mouth.    . SYNTHROID 75 MCG tablet daily.     . temazepam (RESTORIL) 15 MG capsule as needed.     . TURMERIC PO Take by mouth.    . Vitamin D, Ergocalciferol, (DRISDOL) 50000 UNITS CAPS capsule Patient takes it every other week.    . Acetaminophen (TYLENOL PO) Take by mouth as needed.    Marland Kitchen  aspirin 81 MG tablet Take 81 mg by mouth daily.    Marland Kitchen omeprazole (PRILOSEC) 40 MG capsule daily.     No current facility-administered medications for this visit.    Family History  Problem Relation Age of Onset  . Cancer Father     Esophageal    ROS:  Pertinent items are noted in HPI.  Otherwise, a comprehensive ROS was negative.  Exam:   BP 138/86 mmHg  Pulse 78  Resp 14  Ht 5' 1.75" (1.568 m)  Wt 164 lb (74.39 kg)  BMI 30.26 kg/m2  LMP 02/19/2001 Height: 5' 1.75" (156.8 cm) Ht Readings from Last 3 Encounters:  05/05/14 5' 1.75" (1.568 m)  05/01/13 5\' 2"  (1.575 m)  11/18/12 5\' 2"  (1.575 m)    General appearance: alert, cooperative and appears stated age Head: Normocephalic, without obvious abnormality, atraumatic Neck: no adenopathy, supple, symmetrical, trachea midline and thyroid normal to inspection and palpation Lungs: clear to auscultation bilaterally Breasts: normal appearance, no masses or tenderness, No nipple retraction or dimpling, No nipple discharge or bleeding, No axillary or supraclavicular adenopathy Heart: regular rate and rhythm Abdomen: soft, non-tender; no masses,  no organomegaly Extremities: extremities normal, atraumatic, no cyanosis or edema Skin: Skin color, texture, turgor normal. No rashes or  lesions Lymph nodes: Cervical, supraclavicular, and axillary nodes normal. No abnormal inguinal nodes palpated Neurologic: Grossly normal   Pelvic: External genitalia:  no lesions              Urethra:  normal appearing urethra with no masses, tenderness or lesions              Bartholin's and Skene's: normal                 Vagina: normal appearing vagina with normal color and discharge, no lesions              Cervix: normal, non tender, flat against posterior vaginal wall, slight bleeding with pap only              Pap taken: Yes.   Bimanual Exam:  Uterus:  normal size, contour, position, consistency, mobility, non-tender              Adnexa: normal  adnexa and no mass, fullness, tenderness               Rectovaginal: Confirms               Anus:  normal sphincter tone, no lesions  Chaperone present: Yes  A:  Well Woman with normal exam  Menopausal no HRT  Vaginal dryness  Hypothyroid with PCP management  P:   Reviewed health and wellness pertinent to exam  Aware of need to evaluate if vaginal bleeding  Discussed coconut oil for sexual activity and twice weekly.  Will advise if no change  Continue follow up with PCP  Pap smear taken today with HPV reflex   counseled on breast self exam, mammography screening, adequate intake of calcium and vitamin D, diet and exercise  return annually or prn  An After Visit Summary was printed and given to the patient.

## 2014-05-06 NOTE — Progress Notes (Signed)
Reviewed personally.  M. Suzanne Deseree Zemaitis, MD.  

## 2014-05-07 LAB — IPS PAP TEST WITH REFLEX TO HPV

## 2014-06-02 ENCOUNTER — Telehealth: Payer: Self-pay | Admitting: Certified Nurse Midwife

## 2014-06-02 NOTE — Telephone Encounter (Signed)
Left message regarding upcoming appointment has been canceled and needs to be rescheduled. °

## 2014-12-25 ENCOUNTER — Other Ambulatory Visit: Payer: Self-pay | Admitting: Family Medicine

## 2014-12-25 DIAGNOSIS — M8580 Other specified disorders of bone density and structure, unspecified site: Secondary | ICD-10-CM

## 2015-01-04 ENCOUNTER — Other Ambulatory Visit: Payer: Self-pay | Admitting: Family Medicine

## 2015-01-04 DIAGNOSIS — Z1231 Encounter for screening mammogram for malignant neoplasm of breast: Secondary | ICD-10-CM

## 2015-01-27 ENCOUNTER — Other Ambulatory Visit: Payer: PRIVATE HEALTH INSURANCE

## 2015-02-11 ENCOUNTER — Ambulatory Visit
Admission: RE | Admit: 2015-02-11 | Discharge: 2015-02-11 | Disposition: A | Discharge status: Home / Self Care | Payer: Medicare Other | Source: Ambulatory Visit | Attending: Family Medicine | Admitting: Family Medicine

## 2015-02-11 DIAGNOSIS — Z1231 Encounter for screening mammogram for malignant neoplasm of breast: Secondary | ICD-10-CM

## 2015-02-11 DIAGNOSIS — M8580 Other specified disorders of bone density and structure, unspecified site: Secondary | ICD-10-CM

## 2015-03-10 DIAGNOSIS — Z96652 Presence of left artificial knee joint: Secondary | ICD-10-CM | POA: Insufficient documentation

## 2015-03-10 HISTORY — PX: MEDIAL PARTIAL KNEE REPLACEMENT: SHX5965

## 2015-05-09 ENCOUNTER — Ambulatory Visit: Payer: Medicare Other | Admitting: Certified Nurse Midwife

## 2015-05-18 ENCOUNTER — Ambulatory Visit (INDEPENDENT_AMBULATORY_CARE_PROVIDER_SITE_OTHER): Payer: Medicare Other | Admitting: Certified Nurse Midwife

## 2015-05-18 ENCOUNTER — Encounter: Payer: Self-pay | Admitting: Certified Nurse Midwife

## 2015-05-18 VITALS — BP 118/80 | HR 78 | Resp 16 | Ht 61.5 in | Wt 172.0 lb

## 2015-05-18 DIAGNOSIS — Z0000 Encounter for general adult medical examination without abnormal findings: Secondary | ICD-10-CM | POA: Diagnosis not present

## 2015-05-18 DIAGNOSIS — Z01419 Encounter for gynecological examination (general) (routine) without abnormal findings: Secondary | ICD-10-CM

## 2015-05-18 DIAGNOSIS — Z124 Encounter for screening for malignant neoplasm of cervix: Secondary | ICD-10-CM | POA: Diagnosis not present

## 2015-05-18 LAB — POCT URINALYSIS DIPSTICK
BILIRUBIN UA: NEGATIVE
GLUCOSE UA: NEGATIVE
Ketones, UA: NEGATIVE
Leukocytes, UA: NEGATIVE
Nitrite, UA: NEGATIVE
Protein, UA: NEGATIVE
RBC UA: NEGATIVE
Urobilinogen, UA: NEGATIVE
pH, UA: 5

## 2015-05-18 NOTE — Progress Notes (Signed)
Encounter reviewed Thanya Cegielski, MD   

## 2015-05-18 NOTE — Patient Instructions (Signed)

## 2015-05-18 NOTE — Progress Notes (Signed)
65 y.o. [id number] Divorced  Caucasian Fe here for annual exam. Menopausal no HRT. Denies vaginal bleeding and dryness.  Using coconut oil for dryness, working well. Occasional night urination, no issues. Some dry skin using lotion with good response. Sees Dr. Inda Merlin yearly for Hypothyroid medication  Management, labs and aex.  Recent partial knee replacement left, in PT now doing well. No other health issues today. Expecting new grandchild this summer!  No LMP recorded. Patient is not currently having periods (Reason: Other).          Sexually active: Yes.    The current method of family planning is post menopausal status.    Exercising: Yes.    water aerobics & physical therapy for knee Smoker:  no  Health Maintenance: Pap: 05-05-13 neg HPV HR neg MMG:  02-11-15 category c density,birads 1:neg Colonoscopy:  2015 neg f/u 45yrs BMD:   2016 normal per patient PCP  manages TDaP:  2014 Shingles: had done maybe 3 yrs ago Pneumonia: 2016 Hep C and HIV: not done declines Labs: poct urine-neg Self breast exam: done occ   reports that she has quit smoking. She does not have any smokeless tobacco history on file. She reports that she drinks about 0.6 - 1.2 oz of alcohol per week. She reports that she does not use illicit drugs.  Past Medical History  Diagnosis Date  . Thrombophlebitis 11/2000  . Thyroid nodule 09/2002  . Fibroid   . Acid reflux     Past Surgical History  Procedure Laterality Date  . Tonsillectomy and adenoidectomy    . Breast lumpectomy Bilateral 1978; 1989    Benign  . Dilation and curettage of uterus  1994  . Thyroid lobectomy Right 02/2003  . Knee surgery Left 2012  . Foot surgery Bilateral     Both feet operated on  . Hysteroscopy  01/11/12    D&C  . Medial partial knee replacement Left 03/10/15    Current Outpatient Prescriptions  Medication Sig Dispense Refill  . Acetaminophen (TYLENOL PO) Take by mouth as needed.    Marland Kitchen CALCIUM PO Take by mouth daily. + magnesium  citrate    . Cholecalciferol (VITAMIN D PO) Take by mouth daily.    Marland Kitchen GLUCOSAMINE-CHONDROITIN PO Take 1,500 mg by mouth daily.    . Multiple Vitamins-Minerals (MULTIVITAMIN PO) Take by mouth.    . Omega-3 Fatty Acids (FISH OIL PO) Take 1,200 mg by mouth daily.    Marland Kitchen omeprazole (PRILOSEC) 40 MG capsule daily.    Marland Kitchen SYNTHROID 75 MCG tablet daily.     . temazepam (RESTORIL) 15 MG capsule as needed.     . TURMERIC PO Take by mouth. + curcumin    . Vitamin D, Ergocalciferol, (DRISDOL) 50000 UNITS CAPS capsule Patient takes it every other week.     No current facility-administered medications for this visit.    Family History  Problem Relation Age of Onset  . Cancer Father     Esophageal    ROS:  Pertinent items are noted in HPI.  Otherwise, a comprehensive ROS was negative.  Exam:   BP 118/80 mmHg  Pulse 78  Resp 16  Ht 5' 1.5" (1.562 m)  Wt 172 lb (78.019 kg)  BMI 31.98 kg/m2 Height: 5' 1.5" (156.2 cm) Ht Readings from Last 3 Encounters:  05/18/15 5' 1.5" (1.562 m)  05/05/14 5' 1.75" (1.568 m)  05/01/13 5\' 2"  (1.575 m)    General appearance: alert, cooperative and appears stated age Head: Normocephalic, without  obvious abnormality, atraumatic Neck: no adenopathy, supple, symmetrical, trachea midline and thyroid normal to inspection and palpation Lungs: clear to auscultation bilaterally Breasts: normal appearance, no masses or tenderness, No nipple retraction or dimpling, No nipple discharge or bleeding, No axillary or supraclavicular adenopathy Heart: regular rate and rhythm Abdomen: soft, non-tender; no masses,  no organomegaly Extremities: extremities normal, atraumatic, no cyanosis or edema Skin: Skin color, texture, turgor normal. No rashes or lesions Lymph nodes: Cervical, supraclavicular, and axillary nodes normal. No abnormal inguinal nodes palpated Neurologic: Grossly normal   Pelvic: External genitalia:  no lesions              Urethra:  normal appearing urethra  with no masses, tenderness or lesions              Bartholin's and Skene's: normal                 Vagina: normal appearing vagina with normal color and discharge, no lesions, with dryness noted              Cervix: no lesions and normal appearance scant blood with pap only              Pap taken: Yes.   Bimanual Exam:  Uterus:  normal size, contour, position, consistency, mobility, non-tender              Adnexa: normal adnexa and no mass, fullness, tenderness               Rectovaginal: Confirms               Anus:  normal sphincter tone, no lesions  Chaperone present: yes  A:  Well Woman with normal exam  Menopausal no HRT  Vaginal dryness using coconut oil  Hypothyroid with PCP management  Recent L knee partial replacement in follow up  P:   Reviewed health and wellness pertinent to exam  Aware of need to evaluate if vaginal bleeding  Discussed using coconut oil more frequently during week to increase moisture. Patient will use 3-4 x weekly.  Pap smear as above   counseled on breast self exam, mammography screening, adequate intake of calcium and vitamin D, diet and exercise  return annually or prn  An After Visit Summary was printed and given to the patient.

## 2015-05-19 LAB — IPS PAP SMEAR ONLY

## 2016-01-23 ENCOUNTER — Other Ambulatory Visit: Payer: Self-pay | Admitting: Family Medicine

## 2016-01-23 DIAGNOSIS — Z1231 Encounter for screening mammogram for malignant neoplasm of breast: Secondary | ICD-10-CM

## 2016-03-06 ENCOUNTER — Ambulatory Visit
Admission: RE | Admit: 2016-03-06 | Discharge: 2016-03-06 | Disposition: A | Discharge status: Home / Self Care | Payer: Medicare Other | Source: Ambulatory Visit | Attending: Family Medicine | Admitting: Family Medicine

## 2016-03-06 DIAGNOSIS — Z1231 Encounter for screening mammogram for malignant neoplasm of breast: Secondary | ICD-10-CM

## 2016-03-09 ENCOUNTER — Other Ambulatory Visit: Payer: Self-pay | Admitting: Family Medicine

## 2016-03-09 DIAGNOSIS — R928 Other abnormal and inconclusive findings on diagnostic imaging of breast: Secondary | ICD-10-CM

## 2016-03-20 ENCOUNTER — Ambulatory Visit
Admission: RE | Admit: 2016-03-20 | Discharge: 2016-03-20 | Disposition: A | Discharge status: Home / Self Care | Payer: Medicare Other | Source: Ambulatory Visit | Attending: Family Medicine | Admitting: Family Medicine

## 2016-03-20 DIAGNOSIS — R928 Other abnormal and inconclusive findings on diagnostic imaging of breast: Secondary | ICD-10-CM

## 2016-05-22 ENCOUNTER — Ambulatory Visit (INDEPENDENT_AMBULATORY_CARE_PROVIDER_SITE_OTHER): Payer: Medicare Other | Admitting: Certified Nurse Midwife

## 2016-05-22 ENCOUNTER — Encounter: Payer: Self-pay | Admitting: Certified Nurse Midwife

## 2016-05-22 VITALS — BP 112/70 | HR 80 | Resp 16 | Ht 61.25 in | Wt 171.0 lb

## 2016-05-22 DIAGNOSIS — Z01419 Encounter for gynecological examination (general) (routine) without abnormal findings: Secondary | ICD-10-CM

## 2016-05-22 DIAGNOSIS — Z0000 Encounter for general adult medical examination without abnormal findings: Secondary | ICD-10-CM

## 2016-05-22 DIAGNOSIS — N951 Menopausal and female climacteric states: Secondary | ICD-10-CM | POA: Diagnosis not present

## 2016-05-22 NOTE — Progress Notes (Signed)
66 y.o. [id number] Divorced  Caucasian Fe here for annual exam. Menopausal no HRT. Denies vaginal bleeding. Using coconut oil for vaginal dryness, but does  not feel this is working. Patient does not want to use estrogen. Sexually active now, using lubricant for sexual activity without problems.  Sees PCP for Hypothyroid,osteoporosis, and Vit. D deficiency/labs and aex. Patient would like screening labs for HIV,Hep C, ex spouse deceased recently and found some records that were concerning and would like to be tested. No other health issues today.  Patient's last menstrual period was 09/12/2012.          Sexually active: Yes.    The current method of family planning is post menopausal status.    Exercising: No.  The patient does not participate in regular exercise at present. Smoker:  no  Health Maintenance: Pap: 05/18/15 Pap Smear Neg; 05/05/14 Pap Smear Neg; 05-05-13 neg HPV HR neg MMG:  03/20/16 BIRADS 2 Benign/density C Colonoscopy:  2015 neg f/u 7yrs BMD:   2016 normal per patient PCP  manages TDaP:   2014 Shingles: PCP Pneumonia: 2016 Hep C and HIV: discuss today Labs: PCP takes care of labs Self breast exam: monthly   reports that she has quit smoking. She has never used smokeless tobacco. She reports that she drinks about 0.6 - 1.2 oz of alcohol per week . She reports that she does not use drugs.  Past Medical History:  Diagnosis Date  . Acid reflux   . Fibroid   . Thrombophlebitis 11/2000  . Thyroid nodule 09/2002    Past Surgical History:  Procedure Laterality Date  . BREAST LUMPECTOMY Bilateral 1978; 1989   Benign  . DILATION AND CURETTAGE OF UTERUS  1994  . FOOT SURGERY Bilateral    Both feet operated on  . HYSTEROSCOPY  01/11/12   D&C  . KNEE SURGERY Left 2012  . MEDIAL PARTIAL KNEE REPLACEMENT Left 03/10/15  . THYROID LOBECTOMY Right 02/2003  . TONSILLECTOMY AND ADENOIDECTOMY      Current Outpatient Prescriptions  Medication Sig Dispense Refill  . Acetaminophen  (TYLENOL PO) Take by mouth as needed.    Marland Kitchen alendronate (FOSAMAX) 70 MG tablet Take 70 mg by mouth once a week. Take with a full glass of water on an empty stomach.    . Ascorbic Acid (VITAMIN C) 1000 MG tablet Take 1,000 mg by mouth daily.    Marland Kitchen CALCIUM PO Take by mouth daily. + magnesium citrate    . celecoxib (CELEBREX) 200 MG capsule Take 200 mg by mouth 3 (three) times a week.  0  . Cholecalciferol (VITAMIN D PO) Take 1.25 mg by mouth. Every other week    . GLUCOSAMINE-CHONDROITIN PO Take 1,500 mg by mouth daily.    . Multiple Vitamins-Minerals (MULTIVITAMIN PO) Take by mouth.    . Omega-3 Fatty Acids (FISH OIL PO) Take 1,200 mg by mouth daily.    Marland Kitchen omeprazole (PRILOSEC) 40 MG capsule daily.    Marland Kitchen SYNTHROID 75 MCG tablet daily.     . temazepam (RESTORIL) 15 MG capsule as needed.     . TURMERIC PO Take by mouth. + curcumin    . Vitamin D, Ergocalciferol, (DRISDOL) 50000 units CAPS capsule TAKE 1 CAPSULE BY MOUTH ONCE EVERY OTHER WEEK  3   No current facility-administered medications for this visit.     Family History  Problem Relation Age of Onset  . Cancer Father     Esophageal    ROS:  Pertinent items are  noted in HPI.  Otherwise, a comprehensive ROS was negative.  Exam:   BP 112/70 (BP Location: Right Arm, Patient Position: Sitting, Cuff Size: Normal)   Pulse 80   Resp 16   Ht 5' 1.25" (1.556 m)   Wt 171 lb (77.6 kg)   LMP 09/12/2012   BMI 32.05 kg/m  Height: 5' 1.25" (155.6 cm) Ht Readings from Last 3 Encounters:  05/22/16 5' 1.25" (1.556 m)  05/18/15 5' 1.5" (1.562 m)  05/05/14 5' 1.75" (1.568 m)    General appearance: alert, cooperative and appears stated age Head: Normocephalic, without obvious abnormality, atraumatic Neck: no adenopathy, supple, symmetrical, trachea midline and thyroid normal to inspection and palpation Lungs: clear to auscultation bilaterally Breasts: normal appearance, no masses or tenderness, No nipple retraction or dimpling, No nipple  discharge or bleeding, No axillary or supraclavicular adenopathy Heart: regular rate and rhythm Abdomen: soft, non-tender; no masses,  no organomegaly Extremities: extremities normal, atraumatic, no cyanosis or edema Skin: Skin color, texture, turgor normal. No rashes or lesions Lymph nodes: Cervical, supraclavicular, and axillary nodes normal. No abnormal inguinal nodes palpated Neurologic: Grossly normal   Pelvic: External genitalia:  no lesions              Urethra:  normal appearing urethra with no masses, tenderness or lesions              Bartholin's and Skene's: normal                 Vagina: normal appearing vagina with normal color and discharge, no lesions              Cervix: no cervical motion tenderness and no lesions              Pap taken: No Bimanual Exam:  Uterus:  normal size, contour, position, consistency, mobility, non-tender              Adnexa: normal adnexa and no mass, fullness, tenderness               Rectovaginal: Confirms               Anus:  normal sphincter tone, no lesions  Chaperone present: yes  A:  Well Woman with normal exam  Menopausal no HRT  Vaginal dryness  Hypothyroid/Osteoporosis with PCP management  Screening labs  P:   Reviewed health and wellness pertinent to exam  Aware of need to evaluate if vaginal bleeding  Discussed light Virgin Olive Oil use and instructions given. Had tried Replens and had problems with . Will advise if no change.  Continue with PCP as indicated.  Lab: HIV,Hep C  Pap smear as above not taken   counseled on breast self exam, mammography screening, STD prevention, HIV risk factors and prevention, adequate intake of calcium and vitamin D, diet and exercise  return annually or prn  An After Visit Summary was printed and given to the patient.

## 2016-05-22 NOTE — Patient Instructions (Signed)

## 2016-05-23 LAB — HEPATITIS C ANTIBODY: HCV Ab: NEGATIVE

## 2016-05-23 LAB — HIV ANTIBODY (ROUTINE TESTING W REFLEX): HIV 1&2 Ab, 4th Generation: NONREACTIVE

## 2016-05-25 NOTE — Progress Notes (Signed)
Encounter reviewed Jill Jertson, MD   

## 2016-06-01 ENCOUNTER — Other Ambulatory Visit: Payer: Self-pay | Admitting: Family Medicine

## 2016-06-04 ENCOUNTER — Other Ambulatory Visit: Payer: Self-pay | Admitting: Family Medicine

## 2016-06-04 DIAGNOSIS — I739 Peripheral vascular disease, unspecified: Secondary | ICD-10-CM

## 2016-06-12 ENCOUNTER — Ambulatory Visit
Admission: RE | Admit: 2016-06-12 | Discharge: 2016-06-12 | Disposition: A | Discharge status: Home / Self Care | Payer: Medicare Other | Source: Ambulatory Visit | Attending: Family Medicine | Admitting: Family Medicine

## 2016-06-12 DIAGNOSIS — I739 Peripheral vascular disease, unspecified: Secondary | ICD-10-CM

## 2017-03-19 ENCOUNTER — Other Ambulatory Visit: Payer: Self-pay | Admitting: Family Medicine

## 2017-03-19 DIAGNOSIS — Z1231 Encounter for screening mammogram for malignant neoplasm of breast: Secondary | ICD-10-CM

## 2017-04-09 ENCOUNTER — Ambulatory Visit: Payer: Medicare Other

## 2017-04-29 ENCOUNTER — Ambulatory Visit
Admission: RE | Admit: 2017-04-29 | Discharge: 2017-04-29 | Disposition: A | Discharge status: Home / Self Care | Payer: Medicare Other | Source: Ambulatory Visit | Attending: Family Medicine | Admitting: Family Medicine

## 2017-04-29 DIAGNOSIS — Z1231 Encounter for screening mammogram for malignant neoplasm of breast: Secondary | ICD-10-CM

## 2017-05-23 ENCOUNTER — Ambulatory Visit: Payer: Medicare Other | Admitting: Certified Nurse Midwife

## 2017-05-24 ENCOUNTER — Ambulatory Visit: Payer: Medicare Other | Admitting: Certified Nurse Midwife

## 2017-05-28 ENCOUNTER — Encounter: Payer: Self-pay | Admitting: Certified Nurse Midwife

## 2017-05-28 ENCOUNTER — Other Ambulatory Visit: Payer: Self-pay

## 2017-05-28 ENCOUNTER — Ambulatory Visit (INDEPENDENT_AMBULATORY_CARE_PROVIDER_SITE_OTHER): Payer: Medicare Other | Admitting: Certified Nurse Midwife

## 2017-05-28 VITALS — BP 126/80 | HR 80 | Resp 18 | Ht 61.5 in | Wt 168.0 lb

## 2017-05-28 DIAGNOSIS — Z8639 Personal history of other endocrine, nutritional and metabolic disease: Secondary | ICD-10-CM

## 2017-05-28 DIAGNOSIS — Z780 Asymptomatic menopausal state: Secondary | ICD-10-CM | POA: Diagnosis not present

## 2017-05-28 DIAGNOSIS — Z01419 Encounter for gynecological examination (general) (routine) without abnormal findings: Secondary | ICD-10-CM | POA: Diagnosis not present

## 2017-05-28 NOTE — Progress Notes (Signed)
67 y.o. [id number] Divorced  Caucasian Fe here for annual exam.  Menopausal no HRT. Denies vaginal bleeding or vaginal dryness. Has started on Lipitor now by Dr. Inda Merlin and feel this is causing increase joint pain. Continues on thyroid medication with no changes. Dr. Irish Elders manages Celebrex from previous knee sugery and she has taken this to help with joint pain.Marland Kitchen Continues on  Fosamax with PCP management, due for BMD this year. Patient has had no other health issues today. Busy with family.  Patient's last menstrual period was 09/12/2012.          Sexually active: Yes.    The current method of family planning is post menopausal status.    Exercising: No.  The patient does not participate in regular exercise at present. Smoker:  no  Health Maintenance: Pap:  05-18-15 neg History of Abnormal Pap: yes, years ago per patient MMG:  04-29-17 category c density birads 1:neg Self Breast exams: yes Colonoscopy:  2015 neg f/u 10yrs BMD:   2016 pcp manages TDaP:  2014 Shingles: pcp Pneumonia: 2016 Hep C and HIV: both neg 2018 Labs: PCP   reports that she has quit smoking. She has never used smokeless tobacco. She reports that she drinks about 0.6 - 1.2 oz of alcohol per week. She reports that she does not use drugs.  Past Medical History:  Diagnosis Date  . Acid reflux   . Fibroid   . Thrombophlebitis 11/2000  . Thyroid nodule 09/2002    Past Surgical History:  Procedure Laterality Date  . BREAST LUMPECTOMY Bilateral 1978; 1989   Benign  . DILATION AND CURETTAGE OF UTERUS  1994  . FOOT SURGERY Bilateral    Both feet operated on  . HYSTEROSCOPY  01/11/12   D&C  . KNEE SURGERY Left 2012  . MEDIAL PARTIAL KNEE REPLACEMENT Left 03/10/15  . THYROID LOBECTOMY Right 02/2003  . TONSILLECTOMY AND ADENOIDECTOMY      Current Outpatient Medications  Medication Sig Dispense Refill  . Acetaminophen (TYLENOL PO) Take by mouth as needed.    Marland Kitchen alendronate (FOSAMAX) 70 MG tablet Take 70 mg by mouth once a  week. Take with a full glass of water on an empty stomach.    . Ascorbic Acid (VITAMIN C) 1000 MG tablet Take 1,000 mg by mouth daily.    Marland Kitchen atorvastatin (LIPITOR) 10 MG tablet Take 10 mg by mouth daily.  2  . CALCIUM PO Take by mouth daily. + magnesium citrate    . celecoxib (CELEBREX) 200 MG capsule Take 200 mg by mouth 3 (three) times a week.  0  . GLUCOSAMINE-CHONDROITIN PO Take 1,500 mg by mouth daily.    Marland Kitchen METRONIDAZOLE, TOPICAL, 0.75 % LOTN APPLY TO FACE IN THE MORNING AS DIRECTED  3  . Misc Natural Products (GLUCOSAMINE CHOND COMPLEX/MSM PO) Take by mouth.    . mometasone (ELOCON) 0.1 % cream     . Multiple Vitamins-Minerals (MULTIVITAMIN PO) Take by mouth.    . Omega-3 Fatty Acids (FISH OIL PO) Take 1,200 mg by mouth daily.    Marland Kitchen omeprazole (PRILOSEC) 40 MG capsule daily.    Marland Kitchen SYNTHROID 75 MCG tablet daily.     . temazepam (RESTORIL) 15 MG capsule as needed.     . TURMERIC PO Take by mouth. + curcumin    . Vitamin D, Ergocalciferol, (DRISDOL) 50000 units CAPS capsule TAKE 1 CAPSULE BY MOUTH ONCE EVERY OTHER WEEK  3   No current facility-administered medications for this visit.  Family History  Problem Relation Age of Onset  . Cancer Father        Esophageal    ROS:  Pertinent items are noted in HPI.  Otherwise, a comprehensive ROS was negative.  Exam:   BP 126/80 (BP Location: Right Arm, Patient Position: Sitting, Cuff Size: Normal)   Pulse 80   Resp 18   Ht 5' 1.5" (1.562 m)   Wt 168 lb (76.2 kg)   LMP 09/12/2012   BMI 31.23 kg/m  Height: 5' 1.5" (156.2 cm) Ht Readings from Last 3 Encounters:  05/28/17 5' 1.5" (1.562 m)  05/22/16 5' 1.25" (1.556 m)  05/18/15 5' 1.5" (1.562 m)    General appearance: alert, cooperative and appears stated age Head: Normocephalic, without obvious abnormality, atraumatic Neck: no adenopathy, supple, symmetrical, trachea midline and thyroid normal to inspection and palpation Lungs: clear to auscultation bilaterally Breasts: normal  appearance, no masses or tenderness, No nipple retraction or dimpling, No nipple discharge or bleeding, No axillary or supraclavicular adenopathy Heart: regular rate and rhythm Abdomen: soft, non-tender; no masses,  no organomegaly Extremities: extremities normal, atraumatic, no cyanosis or edema Skin: Skin color, texture, turgor normal. No rashes or lesions Lymph nodes: Cervical, supraclavicular, and axillary nodes normal. No abnormal inguinal nodes palpated Neurologic: Grossly normal   Pelvic: External genitalia:  no lesions              Urethra:  normal appearing urethra with no masses, tenderness or lesions              Bartholin's and Skene's: normal                 Vagina: normal appearing vagina with normal color and discharge, no lesions              Cervix: multiparous appearance, no cervical motion tenderness and no lesions              Pap taken: No. Bimanual Exam:  Uterus:  normal size, contour, position, consistency, mobility, non-tender and anteverted              Adnexa: normal adnexa and no mass, fullness, tenderness               Rectovaginal: Confirms               Anus:  normal sphincter tone, no lesions  Chaperone present: yes  A:  Well Woman with normal exam  Menopausal no HRT  MD management of cholesterol,thyroid,GERD, Vitamin D,Fosamax  BMD due works with PCP regarding  P:   Reviewed health and wellness pertinent to exam  Aware of need to advise if vaginal bleeding  Continue with PCP as indicated  Pap smear: no   counseled on breast self exam, mammography screening, feminine hygiene, adequate intake of calcium and vitamin D, diet and exercise  return annually or prn  An After Visit Summary was printed and given to the patient.

## 2017-05-28 NOTE — Patient Instructions (Signed)

## 2017-07-11 DIAGNOSIS — M25561 Pain in right knee: Secondary | ICD-10-CM | POA: Insufficient documentation

## 2018-01-28 ENCOUNTER — Other Ambulatory Visit: Payer: Self-pay | Admitting: Family Medicine

## 2018-01-28 DIAGNOSIS — M810 Age-related osteoporosis without current pathological fracture: Secondary | ICD-10-CM

## 2018-03-20 ENCOUNTER — Ambulatory Visit
Admission: RE | Admit: 2018-03-20 | Discharge: 2018-03-20 | Disposition: A | Discharge status: Home / Self Care | Payer: Medicare Other | Source: Ambulatory Visit | Attending: Family Medicine | Admitting: Family Medicine

## 2018-03-20 DIAGNOSIS — M810 Age-related osteoporosis without current pathological fracture: Secondary | ICD-10-CM

## 2018-03-21 ENCOUNTER — Other Ambulatory Visit: Payer: Self-pay | Admitting: Family Medicine

## 2018-03-21 DIAGNOSIS — Z1231 Encounter for screening mammogram for malignant neoplasm of breast: Secondary | ICD-10-CM

## 2018-05-06 ENCOUNTER — Ambulatory Visit
Admission: RE | Admit: 2018-05-06 | Discharge: 2018-05-06 | Disposition: A | Discharge status: Home / Self Care | Payer: Medicare Other | Source: Ambulatory Visit | Attending: Family Medicine | Admitting: Family Medicine

## 2018-05-06 ENCOUNTER — Other Ambulatory Visit: Payer: Self-pay

## 2018-05-06 DIAGNOSIS — Z1231 Encounter for screening mammogram for malignant neoplasm of breast: Secondary | ICD-10-CM

## 2018-05-08 ENCOUNTER — Other Ambulatory Visit: Payer: Self-pay | Admitting: Family Medicine

## 2018-05-08 DIAGNOSIS — R928 Other abnormal and inconclusive findings on diagnostic imaging of breast: Secondary | ICD-10-CM

## 2018-05-12 ENCOUNTER — Other Ambulatory Visit: Payer: Self-pay

## 2018-05-12 ENCOUNTER — Ambulatory Visit: Payer: Medicare Other

## 2018-05-12 ENCOUNTER — Ambulatory Visit
Admission: RE | Admit: 2018-05-12 | Discharge: 2018-05-12 | Disposition: A | Discharge status: Home / Self Care | Payer: Medicare Other | Source: Ambulatory Visit | Attending: Family Medicine | Admitting: Family Medicine

## 2018-05-12 DIAGNOSIS — R928 Other abnormal and inconclusive findings on diagnostic imaging of breast: Secondary | ICD-10-CM

## 2018-06-03 ENCOUNTER — Ambulatory Visit: Payer: Medicare Other | Admitting: Certified Nurse Midwife

## 2018-08-11 ENCOUNTER — Ambulatory Visit (INDEPENDENT_AMBULATORY_CARE_PROVIDER_SITE_OTHER): Payer: Medicare Other | Admitting: Certified Nurse Midwife

## 2018-08-11 ENCOUNTER — Encounter: Payer: Self-pay | Admitting: Certified Nurse Midwife

## 2018-08-11 ENCOUNTER — Other Ambulatory Visit (HOSPITAL_COMMUNITY)
Admission: RE | Admit: 2018-08-11 | Discharge: 2018-08-11 | Disposition: A | Discharge status: Home / Self Care | Payer: Medicare Other | Source: Ambulatory Visit | Attending: Certified Nurse Midwife | Admitting: Certified Nurse Midwife

## 2018-08-11 ENCOUNTER — Other Ambulatory Visit: Payer: Self-pay

## 2018-08-11 VITALS — BP 118/80 | HR 70 | Temp 97.4°F | Resp 16 | Ht 61.25 in | Wt 174.0 lb

## 2018-08-11 DIAGNOSIS — Z124 Encounter for screening for malignant neoplasm of cervix: Secondary | ICD-10-CM | POA: Diagnosis present

## 2018-08-11 DIAGNOSIS — Z780 Asymptomatic menopausal state: Secondary | ICD-10-CM

## 2018-08-11 DIAGNOSIS — Z1151 Encounter for screening for human papillomavirus (HPV): Secondary | ICD-10-CM | POA: Insufficient documentation

## 2018-08-11 DIAGNOSIS — Z8739 Personal history of other diseases of the musculoskeletal system and connective tissue: Secondary | ICD-10-CM

## 2018-08-11 DIAGNOSIS — Z01419 Encounter for gynecological examination (general) (routine) without abnormal findings: Secondary | ICD-10-CM

## 2018-08-11 DIAGNOSIS — N951 Menopausal and female climacteric states: Secondary | ICD-10-CM | POA: Diagnosis not present

## 2018-08-11 NOTE — Progress Notes (Signed)
68 y.o. [id number] Divorced  Caucasian Fe here for annual exam. Post menopausal no vaginal bleeding. Some vaginal dryness with sexual activity, but no issues. No STD concerns. Sees PCP for labs and cholesterol, reflux,thyroid, Vitamin D, Fosamax. Recent BMD. Recent trip with [facility] and enjoyed being out of the house. No health issues today.  Patient's last menstrual period was 09/12/2012.          Sexually active: Yes.    The current method of family planning is post menopausal status.    Exercising: No.  exercise Smoker:  no  Review of Systems  Constitutional: Negative.   HENT: Negative.   Eyes: Negative.   Respiratory: Negative.   Cardiovascular: Negative.   Gastrointestinal: Negative.   Genitourinary: Negative.   Musculoskeletal: Negative.   Skin: Negative.   Neurological: Negative.   Endo/Heme/Allergies: Negative.   Psychiatric/Behavioral: Negative.     Health Maintenance: Pap:  05-18-15 neg History of Abnormal Pap: yrs ago per patient MMG:  05-12-2018 bilateral & right breast mmg category b density birads 1:neg Self Breast exams: yes Colonoscopy:  2015 neg f/u 36yrs BMD:   2020 pcp manages  TDaP:  2014 Shingles: 2020 Pneumonia: 2016 Hep C and HIV: both neg 2018 Labs: if needed   reports that she has quit smoking. She has never used smokeless tobacco. She reports current alcohol use of about 1.0 - 2.0 standard drinks of alcohol per week. She reports that she does not use drugs.  Past Medical History:  Diagnosis Date  . Acid reflux   . Fibroid   . Thrombophlebitis 11/2000  . Thyroid nodule 09/2002    Past Surgical History:  Procedure Laterality Date  . BREAST EXCISIONAL BIOPSY Left   . BREAST EXCISIONAL BIOPSY Right   . DILATION AND CURETTAGE OF UTERUS  1994  . FOOT SURGERY Bilateral    Both feet operated on  . HYSTEROSCOPY  01/11/12   D&C  . KNEE SURGERY Left 2012  . MEDIAL PARTIAL KNEE REPLACEMENT Left 03/10/15  . THYROID LOBECTOMY Right 02/2003  . TONSILLECTOMY  AND ADENOIDECTOMY      Current Outpatient Medications  Medication Sig Dispense Refill  . Acetaminophen (TYLENOL PO) Take by mouth as needed.    . Ascorbic Acid (VITAMIN C) 1000 MG tablet Take 1,000 mg by mouth daily.    . celecoxib (CELEBREX) 200 MG capsule Take 200 mg by mouth 3 (three) times a week.  0  . Coenzyme Q10 (COQ10 PO) Take by mouth.    Marland Kitchen GLUCOSAMINE-CHONDROITIN PO Take 1,500 mg by mouth daily.    Marland Kitchen METRONIDAZOLE, TOPICAL, 0.75 % LOTN APPLY TO FACE IN THE MORNING AS DIRECTED  3  . mometasone (ELOCON) 0.1 % cream     . Multiple Vitamins-Minerals (MULTIVITAMIN PO) Take by mouth.    Marland Kitchen omeprazole (PRILOSEC) 40 MG capsule daily.    . rosuvastatin (CRESTOR) 5 MG tablet Take 5 mg by mouth daily.    Marland Kitchen SYNTHROID 75 MCG tablet daily.     . temazepam (RESTORIL) 15 MG capsule as needed.     . TURMERIC PO Take by mouth. + curcumin    . Vitamin D, Ergocalciferol, (DRISDOL) 50000 units CAPS capsule Every 3 weeks  3  . alendronate (FOSAMAX) 70 MG tablet Take 70 mg by mouth once a week. Take with a full glass of water on an empty stomach.     No current facility-administered medications for this visit.     Family History  Problem Relation Age of Onset  .  Cancer Father        Esophageal    ROS:  Pertinent items are noted in HPI.  Otherwise, a comprehensive ROS was negative.  Exam:   BP 118/80   Pulse 70   Temp (!) 97.4 F (36.3 C) (Skin)   Resp 16   Ht 5' 1.25" (1.556 m)   Wt 174 lb (78.9 kg)   LMP 09/12/2012   BMI 32.61 kg/m  Height: 5' 1.25" (155.6 cm) Ht Readings from Last 3 Encounters:  08/11/18 5' 1.25" (1.556 m)  05/28/17 5' 1.5" (1.562 m)  05/22/16 5' 1.25" (1.556 m)    General appearance: alert, cooperative and appears stated age Head: Normocephalic, without obvious abnormality, atraumatic Neck: no adenopathy, supple, symmetrical, trachea midline and thyroid normal to inspection and palpation Lungs: clear to auscultation bilaterally Breasts: normal appearance,  no masses or tenderness, No nipple retraction or dimpling, No nipple discharge or bleeding, No axillary or supraclavicular adenopathy Heart: regular rate and rhythm Abdomen: soft, non-tender; no masses,  no organomegaly Extremities: extremities normal, atraumatic, no cyanosis or edema Skin: Skin color, texture, turgor normal. No rashes or lesions Lymph nodes: Cervical, supraclavicular, and axillary nodes normal. No abnormal inguinal nodes palpated Neurologic: Grossly normal   Pelvic: External genitalia:  no lesions              Urethra:  normal appearing urethra with no masses, tenderness or lesions              Bartholin's and Skene's: normal                 Vagina: normal appearing vagina with normal color and discharge, no lesions              Cervix: no bleeding following Pap, no cervical motion tenderness, no lesions and very anterior              Pap taken: Yes.   Bimanual Exam:  Uterus:  normal size, contour, position, consistency, mobility, non-tender and anteverted              Adnexa: normal adnexa and no mass, fullness, tenderness               Rectovaginal: Confirms               Anus:  normal sphincter tone, no lesions  Chaperone present: yes  A:  Well Woman with normal exam  Post menopausal no HRT  Vaginal dryness used coconut oil in past, not now  Recent BMD osteoporosis PCP manages    P:   Reviewed health and wellness pertinent to exam  Aware of need to advise if vaginal bleeding or vaginal dryness changes.  Continue follow up with PCP regarding osteoporosis, cholesterol, thyroid management.  Pap smear: yes   counseled on breast self exam, mammography screening, feminine hygiene, adequate intake of calcium and vitamin D, diet and exercise, Kegel's exercises  return annually or prn  An After Visit Summary was printed and given to the patient.

## 2018-08-13 LAB — CYTOLOGY - PAP
Diagnosis: NEGATIVE
HPV: NOT DETECTED

## 2019-01-29 NOTE — Progress Notes (Deleted)
{  Choose 1 Note Type (Telehealth Visit or Telephone Visit):[phone]}   Date:  01/29/2019   ID:  Christie, Beasley 11-Apr-1950, MRN [id number]  {Patient Location:[phone]::"Home"} {Provider Location:[phone]::"Home"}  PCP:  Christie Austin, MD (Inactive)  Cardiologist:  No primary care provider on file. *** Electrophysiologist:  None   Evaluation Performed:  {Choose Visit Type:[phone]::"Follow-Up Visit"}  Chief Complaint:  ***  History of Present Illness:    Christie Beasley is a 68 y.o. female who is referred by *** for evaluation of palpitations.    The patient {does/does not:200015} have symptoms concerning for COVID-19 infection (fever, chills, cough, or new shortness of breath).    Past Medical History:  Diagnosis Date  . Acid reflux   . Fibroid   . Thrombophlebitis 11/2000  . Thyroid nodule 09/2002   Past Surgical History:  Procedure Laterality Date  . BREAST EXCISIONAL BIOPSY Left   . BREAST EXCISIONAL BIOPSY Right   . DILATION AND CURETTAGE OF UTERUS  1994  . FOOT SURGERY Bilateral    Both feet operated on  . HYSTEROSCOPY  01/11/12   D&C  . KNEE SURGERY Left 2012  . MEDIAL PARTIAL KNEE REPLACEMENT Left 03/10/15  . THYROID LOBECTOMY Right 02/2003  . TONSILLECTOMY AND ADENOIDECTOMY       No outpatient medications have been marked as taking for the 01/30/19 encounter (Appointment) with Minus Breeding, MD.     Allergies:   Lipitor  [atorvastatin calcium]   Social History   Tobacco Use  . Smoking status: Former Research scientist (life sciences)  . Smokeless tobacco: Never Used  Substance Use Topics  . Alcohol use: Yes    Alcohol/week: 1.0 - 2.0 standard drinks    Types: 1 - 2 Standard drinks or equivalent per week  . Drug use: No     Family Hx: The patient's family history includes Cancer in her father.  ROS:   Please see the history of present illness.    *** All other systems reviewed and are negative.   Prior CV studies:   The following studies were  reviewed today:  ***  Labs/Other Tests and Data Reviewed:    EKG:  {EKG/Telemetry Strips Reviewed:[phone]}  Recent Labs: No results found for requested labs within last 8760 hours.   Recent Lipid Panel No results found for: CHOL, TRIG, HDL, CHOLHDL, LDLCALC, LDLDIRECT  Wt Readings from Last 3 Encounters:  08/11/18 174 lb (78.9 kg)  05/28/17 168 lb (76.2 kg)  05/22/16 171 lb (77.6 kg)     Objective:    Vital Signs:  LMP 09/12/2012    {HeartCare Virtual Exam (Optional):[phone]::"VITAL SIGNS:  reviewed"}  ASSESSMENT & PLAN:    PALPITATIONS:  ***   COVID-19 Education: The signs and symptoms of COVID-19 were discussed with the patient and how to seek care for testing (follow up with PCP or arrange E-visit).  ***The importance of social distancing was discussed today.  Time:   Today, I have spent *** minutes with the patient with telehealth technology discussing the above problems.     Medication Adjustments/Labs and Tests Ordered: Current medicines are reviewed at length with the patient today.  Concerns regarding medicines are outlined above.   Tests Ordered: No orders of the defined types were placed in this encounter.   Medication Changes: No orders of the defined types were placed in this encounter.   Follow Up:  {F/U [phone]} {follow up:15908}  Signed, Minus Breeding, MD  01/29/2019 8:09 PM    [place]

## 2019-01-30 ENCOUNTER — Telehealth: Payer: Medicare Other | Admitting: Cardiology

## 2019-02-01 NOTE — Progress Notes (Signed)
Virtual Visit via Video Note   This visit type was conducted due to national recommendations for restrictions regarding the COVID-19 Pandemic (e.g. social distancing) in an effort to limit this patient's exposure and mitigate transmission in our community.  Due to her co-morbid illnesses, this patient is at least at moderate risk for complications without adequate follow up.  This format is felt to be most appropriate for this patient at this time.  All issues noted in this document were discussed and addressed.  A limited physical exam was performed with this format.  Please refer to the patient's chart for her consent to telehealth for [facility].   Date:  02/02/2019   ID:  Rodney Booze, DOB 1950/03/11, MRN [id number]  Patient Location: Home Provider Location: Home  PCP:  Glenis Smoker, MD  Cardiologist:  Minus Breeding, MD  Electrophysiologist:  None   Evaluation Performed:  Follow-Up Visit  Chief Complaint:  Palpitations.   History of Present Illness:    Christie Beasley is a 68 y.o. female who is referred by Glenis Smoker, MD for evaluation of palpitations.  She has no prior cardiac history.  She has not had any prior cardiac work-up.  She did pass out last year and I briefly was able to review some notes from [facility].  This was felt to be dehydration.  I did see an EKG as reported below from a couple of years ago and it was normal.  She was told EKG at [facility] was normal as well.  She denies any cardiovascular symptoms such as presyncope or syncope now.  However, she gets early morning palpitations.  These happen sporadically.  She does not think they wake her up.  She thinks she wakes up with them.  They go away after several minutes.  She does not happen the rest of the day.  She is able to go up and down her stairs frequently.  She denies any cardiovascular symptoms with this.  She has no new shortness of breath, PND or orthopnea.  She has  no weight gain or edema.  The patient does not have symptoms concerning for COVID-19 infection (fever, chills, cough, or new shortness of breath).    Past Medical History:  Diagnosis Date  . Acid reflux   . Fibroid   . Thrombophlebitis 11/2000  . Thyroid nodule 09/2002   Past Surgical History:  Procedure Laterality Date  . BREAST EXCISIONAL BIOPSY Left   . BREAST EXCISIONAL BIOPSY Right   . DILATION AND CURETTAGE OF UTERUS  1994  . FOOT SURGERY Bilateral    Both feet operated on  . HYSTEROSCOPY  01/11/12   D&C  . KNEE SURGERY Left 2012  . MEDIAL PARTIAL KNEE REPLACEMENT Left 03/10/15  . THYROID LOBECTOMY Right 02/2003  . TONSILLECTOMY AND ADENOIDECTOMY      Prior to Admission medications   Medication Sig Start Date End Date Taking? Authorizing Provider  Acetaminophen (TYLENOL PO) Take 500 mg by mouth as needed.    Yes [provider]  alendronate (FOSAMAX) 70 MG tablet Take 70 mg by mouth once a week. Take with a full glass of water on an empty stomach.   Yes [provider]  amoxicillin (AMOXIL) 500 MG capsule Take 500 mg by mouth as needed. One hour prior to dental work   Yes [provider]  celecoxib (CELEBREX) 200 MG capsule Take 200 mg by mouth 3 (three) times a week. 04/16/16  Yes [provider]  Coenzyme Q10 (COQ10 PO) Take 200 mg by mouth daily.    Yes [provider]  fluticasone (FLONASE) 50 MCG/ACT nasal spray Place into both nostrils daily.   Yes [provider]  GLUCOSAMINE-CHONDROITIN PO Take 1,500 mg by mouth daily. Dose: 1500mg (glucosamine)-1200mg (chondroitin)   Yes [provider]  Melatonin 1 MG TABS Take 1 mg by mouth daily. Chewable tab   Yes [provider]  METRONIDAZOLE, TOPICAL, 0.75 % LOTN as needed.  03/20/17  Yes [provider]  mometasone (ELOCON) 0.1 % cream Apply 1 application topically as needed.  03/20/17  Yes [provider]  Multiple Vitamins-Minerals  (MULTIVITAMIN PO) Take by mouth.   Yes [provider]  omeprazole (PRILOSEC) 40 MG capsule daily. 04/28/14  Yes [provider]  rosuvastatin (CRESTOR) 5 MG tablet Take 5 mg by mouth 3 (three) times a week.    Yes [provider]  SYNTHROID 75 MCG tablet daily.  10/24/12  Yes [provider]  TURMERIC PO Take 1,000 mg by mouth. + curcumin   Yes [provider]  Vitamin D, Ergocalciferol, (DRISDOL) 50000 units CAPS capsule Take by mouth. Every 3 weeks 04/02/16  Yes [provider]  Ascorbic Acid (VITAMIN C) 1000 MG tablet Take 1,000 mg by mouth daily.    [provider]    Allergies:   Lipitor  [atorvastatin calcium]   Social History   Tobacco Use  . Smoking status: Former Research scientist (life sciences)  . Smokeless tobacco: Never Used  Substance Use Topics  . Alcohol use: Yes    Alcohol/week: 1.0 - 2.0 standard drinks    Types: 1 - 2 Standard drinks or equivalent per week  . Drug use: No     Family Hx: The patient's family history includes Cancer in her father; Heart disease in her mother.  ROS:   Please see the history of present illness.     All other systems reviewed and are negative.   Prior CV studies:   The following studies were reviewed today:  Labs, Old EKG  Labs/Other Tests and Data Reviewed:    EKG:  An ECG dated 12/08/2014 was personally reviewed today and demonstrated:  Sinus rhythm, rate 55, axis within normal limits, intervals within normal limits, no acute ST-T wave changes.  Recent Labs: No results found for requested labs within last 8760 hours.   Recent Lipid Panel No results found for: CHOL, TRIG, HDL, CHOLHDL, LDLCALC, LDLDIRECT  Wt Readings from Last 3 Encounters:  08/11/18 174 lb (78.9 kg)  05/28/17 168 lb (76.2 kg)  05/22/16 171 lb (77.6 kg)     Objective:    Vital Signs:  LMP 09/12/2012    VITAL SIGNS:  reviewed GEN:  no acute distress NEURO:  alert and oriented x 3, no obvious focal deficit PSYCH:   normal affect  ASSESSMENT & PLAN:    PALPITATIONS: I am going to start with an event monitor.  I initially suggested that she did not need a repeat EKG but I see that this 1 was from 2016.  If we cannot get a copy of the EKG from [facility] earlier this year then it might be reasonable for her to get 1 as planned her primary provider.  She had normal electrolytes and I was able to review these.  She has normal thyroid.  I will see her back in about 6 months for physical exam or sooner pending the results of the monitor.  COVID-19 Education: The signs and symptoms  of COVID-19 were discussed with the patient and how to seek care for testing (follow up with PCP or arrange E-visit).  We did talk about the vaccine.  The importance of social distancing was discussed today.  Time:   Today, I have spent 25 minutes with the patient with telehealth technology discussing the above problems .  Part of this time was spent with the chart greater than half the time with the patient.   Medication Adjustments/Labs and Tests Ordered: Current medicines are reviewed at length with the patient today.  Concerns regarding medicines are outlined above.   Tests Ordered: Orders Placed This Encounter  Procedures  . LONG TERM MONITOR (3-14 DAYS)    Medication Changes: No orders of the defined types were placed in this encounter.   Follow Up:  In Person in six months  Signed, Minus Breeding, MD  02/02/2019 4:21 PM    [facility]

## 2019-02-02 ENCOUNTER — Encounter: Payer: Self-pay | Admitting: Cardiology

## 2019-02-02 ENCOUNTER — Telehealth (INDEPENDENT_AMBULATORY_CARE_PROVIDER_SITE_OTHER): Payer: Medicare Other | Admitting: Cardiology

## 2019-02-02 ENCOUNTER — Telehealth: Payer: Self-pay

## 2019-02-02 ENCOUNTER — Encounter: Payer: Self-pay | Admitting: *Deleted

## 2019-02-02 DIAGNOSIS — R002 Palpitations: Secondary | ICD-10-CM

## 2019-02-02 NOTE — Patient Instructions (Addendum)
Medication Instructions:  Your physician recommends that you continue on your current medications as directed. Please refer to the Current Medication list given to you today.  *If you need a refill on your cardiac medications before your next appointment, please call your pharmacy*  Lab Work: none If you have labs (blood work) drawn today and your tests are completely normal, you will receive your results only by: Marland Kitchen MyChart Message (if you have MyChart) OR . A paper copy in the mail If you have any lab test that is abnormal or we need to change your treatment, we will call you to review the results.  Testing/Procedures: Your physician has recommended that you wear a 14 DAY ZIO-PATCH monitor. The Zio patch cardiac monitor continuously records heart rhythm data for up to 14 days, this is for patients being evaluated for multiple types heart rhythms. For the first 24 hours post application, please avoid getting the Zio monitor wet in the shower or by excessive sweating during exercise. After that, feel free to carry on with regular activities. Keep soaps and lotions away from the ZIO XT Patch.  This will be mailed to you from our [facility] location - [address], [address].        ZIO XT- Long Term Monitor Instructions   Your physician has requested you wear your ZIO patch monitor 14 days.   This is a single patch monitor.  [employer] supplies one patch monitor per enrollment.  Additional stickers are not available.   Please do not apply patch if you will be having a Nuclear Stress Test, Echocardiogram, Cardiac CT, MRI, or Chest Xray during the time frame you would be wearing the monitor. The patch cannot be worn during these tests.  You cannot remove and re-apply the ZIO XT patch monitor.   Your ZIO patch monitor will be sent USPS Priority mail from [facility] directly to your home address. The monitor may also be mailed to a PO BOX if home delivery is not available.   It may take  3-5 days to receive your monitor after you have been enrolled.   Once you have received you monitor, please review enclosed instructions.  Your monitor has already been registered assigning a specific monitor serial # to you.   Applying the monitor   Shave hair from upper left chest.   Hold abrader disc by orange tab.  Rub abrader in 40 strokes over left upper chest as indicated in your monitor instructions.   Clean area with 4 enclosed alcohol pads .  Use all pads to assure are is cleaned thoroughly.  Let dry.   Apply patch as indicated in monitor instructions.  Patch will be place under collarbone on left side of chest with arrow pointing upward.   Rub patch adhesive wings for 2 minutes.Remove white label marked "1".  Remove white label marked "2".  Rub patch adhesive wings for 2 additional minutes.   While looking in a mirror, press and release button in center of patch.  A small green light will flash 3-4 times .  This will be your only indicator the monitor has been turned on.     Do not shower for the first 24 hours.  You may shower after the first 24 hours.   Press button if you feel a symptom. You will hear a small click.  Record Date, Time and Symptom in the Patient Log Book.   When you are ready to remove patch, follow instructions  on last 2 pages of Patient Log Book.  Stick patch monitor onto last page of Patient Log Book.   Place Patient Log Book in [place] box.  Use locking tab on box and tape box closed securely.  The [employer] and [employer] has [employer] on it.  Please place in mailbox as soon as possible.  Your physician should have your test results approximately 7 days after the monitor has been mailed back to [facility].   Call [facility] at [phone] if you have questions regarding your ZIO XT patch monitor.  Call them immediately if you see an orange light blinking on your monitor.   If your monitor falls off in less than 4 days  contact our Monitor department at [phone].  If your monitor becomes loose or falls off after 4 days call [employer] at [phone] for suggestions on securing your monitor.     Follow-Up: At [facility], you and your health needs are our priority.  As part of our continuing mission to provide you with exceptional heart care, we have created designated Provider Care Teams.  These Care Teams include your primary Cardiologist (physician) and Advanced Practice Providers (APPs -  Physician Assistants and Nurse Practitioners) who all work together to provide you with the care you need, when you need it.  Your next appointment:   6 month(s)  The format for your next appointment:   In Person  Provider:   Minus Breeding, MD  Other Instructions PLEASE [place] PROVIDER TO DO AN ELECTROCARDIOGRAM (EKG) READING ON YOU DURING YOUR NEXT VISIT

## 2019-02-02 NOTE — Telephone Encounter (Signed)
Patient and/or DPR-approved person aware of AVS instructions and verbalized understanding.  Letter including After Visit Summary and any other necessary documents to be mailed to the patient's address on file.  Staff message sent to Medical Records pool to mail AVS to patient address.

## 2019-02-02 NOTE — Progress Notes (Signed)
Patient ID: Christie Beasley, female   DOB: 30-Dec-1950, 68 y.o.   MRN: [id number] Patient enrolled for [employer] to mail a 14 day ZIO XT patch monitor to her home.. Billing date will be date monitor started.  [employer] given [place] Medicare insurance information.

## 2019-02-07 ENCOUNTER — Ambulatory Visit (INDEPENDENT_AMBULATORY_CARE_PROVIDER_SITE_OTHER): Payer: Medicare Other

## 2019-02-07 DIAGNOSIS — R002 Palpitations: Secondary | ICD-10-CM | POA: Diagnosis not present

## 2019-02-23 ENCOUNTER — Telehealth: Payer: Self-pay | Admitting: Cardiology

## 2019-02-23 NOTE — Telephone Encounter (Signed)
Patient is inquiring about if enough information has been received from heart monitor because monitor was defective. Patient states she would like to know what to do next. Please advise.

## 2019-02-23 NOTE — Telephone Encounter (Signed)
Pt aware monitor is still pending Pt informed will call with results once reviewed Pt verbalized understanding ./cy

## 2019-03-19 ENCOUNTER — Ambulatory Visit: Payer: Medicare Other

## 2019-03-27 ENCOUNTER — Ambulatory Visit: Payer: Medicare Other

## 2019-03-30 ENCOUNTER — Other Ambulatory Visit: Payer: Self-pay | Admitting: Family Medicine

## 2019-03-30 ENCOUNTER — Ambulatory Visit: Payer: Medicare Other

## 2019-03-30 DIAGNOSIS — Z1231 Encounter for screening mammogram for malignant neoplasm of breast: Secondary | ICD-10-CM

## 2019-05-12 ENCOUNTER — Encounter: Payer: Self-pay | Admitting: Certified Nurse Midwife

## 2019-05-20 ENCOUNTER — Ambulatory Visit: Payer: Medicare PPO

## 2019-05-27 ENCOUNTER — Other Ambulatory Visit: Payer: Self-pay

## 2019-05-27 ENCOUNTER — Ambulatory Visit
Admission: RE | Admit: 2019-05-27 | Discharge: 2019-05-27 | Disposition: A | Discharge status: Home / Self Care | Payer: Medicare PPO | Source: Ambulatory Visit | Attending: Family Medicine | Admitting: Family Medicine

## 2019-05-27 DIAGNOSIS — Z1231 Encounter for screening mammogram for malignant neoplasm of breast: Secondary | ICD-10-CM

## 2019-05-28 ENCOUNTER — Other Ambulatory Visit: Payer: Self-pay | Admitting: Family Medicine

## 2019-05-28 DIAGNOSIS — R928 Other abnormal and inconclusive findings on diagnostic imaging of breast: Secondary | ICD-10-CM

## 2019-06-10 ENCOUNTER — Other Ambulatory Visit: Payer: Self-pay

## 2019-06-10 ENCOUNTER — Ambulatory Visit
Admission: RE | Admit: 2019-06-10 | Discharge: 2019-06-10 | Disposition: A | Discharge status: Home / Self Care | Payer: Medicare PPO | Source: Ambulatory Visit | Attending: Family Medicine | Admitting: Family Medicine

## 2019-06-10 DIAGNOSIS — R928 Other abnormal and inconclusive findings on diagnostic imaging of breast: Secondary | ICD-10-CM

## 2019-07-07 DIAGNOSIS — Z7189 Other specified counseling: Secondary | ICD-10-CM | POA: Insufficient documentation

## 2019-07-07 DIAGNOSIS — R002 Palpitations: Secondary | ICD-10-CM | POA: Insufficient documentation

## 2019-07-07 NOTE — Progress Notes (Signed)
Cardiology Office Note   Date:  07/08/2019   ID:  Christie Beasley, Christie Beasley 11-03-1950, MRN [id number]  PCP:  Christie Smoker, MD  Cardiologist:   Christie Breeding, MD    Chief Complaint  Patient presents with  . Palpitations      History of Present Illness: Christie Beasley is a 69 y.o. female who presents for was referred by Christie Smoker, MD for evaluation of palpitations.  She did wear a monitor and she had no significant palpitations.  Since I last saw her occasionally not had a lot more palpitations.  Sometimes it would be beating fast in the morning but this is not particularly problematic.  Has had a long history of what sounds like vagal syncope but she is not on this recently.  She knows had a treat orthostasis to avoid severe symptoms.  She is not having any new chest discomfort, neck or arm discomfort.  She is not having any new shortness of breath, PND or orthopnea.  She does describe some occasional tingling this is worse in the morning and happens from evening and actually wakes her from her sleep.  This is in the hands.  Past Medical History:  Diagnosis Date  . Acid reflux   . Fibroid   . Thrombophlebitis 11/2000  . Thyroid nodule 09/2002    Past Surgical History:  Procedure Laterality Date  . BREAST EXCISIONAL BIOPSY Left   . BREAST EXCISIONAL BIOPSY Right   . DILATION AND CURETTAGE OF UTERUS  1994  . FOOT SURGERY Bilateral    Both feet operated on  . HYSTEROSCOPY  01/11/12   D&C  . KNEE SURGERY Left 2012  . MEDIAL PARTIAL KNEE REPLACEMENT Left 03/10/15  . THYROID LOBECTOMY Right 02/2003  . TONSILLECTOMY AND ADENOIDECTOMY       Current Outpatient Medications  Medication Sig Dispense Refill  . Acetaminophen (TYLENOL PO) Take 500 mg by mouth as needed.     Marland Kitchen alendronate (FOSAMAX) 70 MG tablet Take 70 mg by mouth once a week. Take with a full glass of water on an empty stomach.    Marland Kitchen amoxicillin (AMOXIL) 500 MG capsule Take 500 mg by  mouth as needed. One hour prior to dental work    . celecoxib (CELEBREX) 200 MG capsule Take 200 mg by mouth 3 (three) times a week.  0  . Coenzyme Q10 (COQ10 PO) Take 200 mg by mouth daily.     . fluticasone (FLONASE) 50 MCG/ACT nasal spray Place into both nostrils daily.    Marland Kitchen GLUCOSAMINE-CHONDROITIN PO Take 1,500 mg by mouth daily. Dose: 1500mg (glucosamine)-1200mg (chondroitin)    . Melatonin 1 MG TABS Take 1 mg by mouth daily. Chewable tab    . METRONIDAZOLE, TOPICAL, 0.75 % LOTN as needed.   3  . mometasone (ELOCON) 0.1 % cream Apply 1 application topically as needed.     . Multiple Vitamins-Minerals (MULTIVITAMIN PO) Take by mouth.    Marland Kitchen omeprazole (PRILOSEC) 40 MG capsule daily.    . rosuvastatin (CRESTOR) 5 MG tablet Take 5 mg by mouth 4 (four) times a week.     Marland Kitchen SYNTHROID 75 MCG tablet daily.     . TURMERIC PO Take 1,000 mg by mouth. + curcumin    . Vitamin D, Ergocalciferol, (DRISDOL) 50000 units CAPS capsule Take by mouth. Every 3 weeks  3   No current facility-administered medications for this visit.    Allergies:   Lipitor  [atorvastatin calcium]    ROS:  Please see the history of present illness.   Otherwise, review of systems are positive for none.   All other systems are reviewed and negative.    PHYSICAL EXAM: VS:  BP 125/73   Pulse 83   Temp (!) 95.8 F (35.4 C)   Ht 5\' 3"  (1.6 m)   Wt 178 lb 9.6 oz (81 kg)   LMP 09/12/2012   SpO2 98%   BMI 31.64 kg/m  , BMI Body mass index is 31.64 kg/m. GENERAL:  Well appearing NECK:  No jugular venous distention, waveform within normal limits, carotid upstroke brisk and symmetric, no bruits, no thyromegaly LUNGS:  Clear to auscultation bilaterally CHEST:  Unremarkable HEART:  PMI not displaced or sustained,S1 and S2 within normal limits, no S3, no S4, no clicks, no rubs, no murmurs ABD:  Flat, positive bowel sounds normal in frequency in pitch, no bruits, no rebound, no guarding, no midline pulsatile mass, no hepatomegaly,  no splenomegaly EXT:  2 plus pulses throughout, no edema, no cyanosis no clubbing   EKG:  EKG is not ordered today.    Recent Labs: No results found for requested labs within last 8760 hours.    Lipid Panel No results found for: CHOL, TRIG, HDL, CHOLHDL, VLDL, LDLCALC, LDLDIRECT    Wt Readings from Last 3 Encounters:  07/08/19 178 lb 9.6 oz (81 kg)  08/11/18 174 lb (78.9 kg)  05/28/17 168 lb (76.2 kg)      Other studies Reviewed: Additional studies/ records that were reviewed today include: None. Review of the above records demonstrates:  Please see elsewhere in the note.     ASSESSMENT AND PLAN:  PALPITATIONS:   He did not particularly problematic.  No change in therapy is indicated.  She will let me know to get worse in the future at which point she might need a monitor again.  1 she had did not work more than a week.  We also talked about wearable.  HAND TINGLING: I suspect this is more nerve for spinal and suggested follow-up with her primary provider.  COVID-19 EDUCATION: She has had her vaccine.    Current medicines are reviewed at length with the patient today.  The patient does not have concerns regarding medicines.  The following changes have been made:  no change  Labs/ tests ordered today include: None No orders of the defined types were placed in this encounter.    Disposition:   FU with me as needed.     Signed, Christie Breeding, MD  07/08/2019 11:57 AM    [facility]

## 2019-07-08 ENCOUNTER — Encounter: Payer: Self-pay | Admitting: Cardiology

## 2019-07-08 ENCOUNTER — Other Ambulatory Visit: Payer: Self-pay

## 2019-07-08 ENCOUNTER — Ambulatory Visit: Payer: Medicare PPO | Admitting: Cardiology

## 2019-07-08 VITALS — BP 125/73 | HR 83 | Temp 95.8°F | Ht 63.0 in | Wt 178.6 lb

## 2019-07-08 DIAGNOSIS — R002 Palpitations: Secondary | ICD-10-CM | POA: Diagnosis not present

## 2019-07-08 DIAGNOSIS — Z7189 Other specified counseling: Secondary | ICD-10-CM

## 2019-07-08 NOTE — Patient Instructions (Signed)
Medication Instructions:  NO CHANGES *If you need a refill on your cardiac medications before your next appointment, please call your pharmacy*  Lab Work: NONE ORDERED THIS VISIT  Testing/Procedures: NONE ORDERED THIS VISIT  Follow-Up: At [facility], you and your health needs are our priority.  As part of our continuing mission to provide you with exceptional heart care, we have created designated Provider Care Teams.  These Care Teams include your primary Cardiologist (physician) and Advanced Practice Providers (APPs -  Physician Assistants and Nurse Practitioners) who all work together to provide you with the care you need, when you need it.  Your next appointment:   FOLLOW UP AS NEEDED 

## 2019-08-12 ENCOUNTER — Ambulatory Visit: Payer: Medicare Other | Admitting: Certified Nurse Midwife

## 2019-10-16 NOTE — Progress Notes (Signed)
69 y.o. [id number] Divorced White or Caucasian female here for breast and pelvic exam.  Denies vaginal bleeding.  Never on HRT.  She is on Fosamax due to osteoporosis.  Reports she's starting having some left nipple pain.  Reports this occurred on Saturday night.  She rolled over and was actually awakened from sleep due to pain.  Reports this felt like a superficial skin thing.  Denies nipple discharge/dblood from nipple.  Patient's last menstrual period was 09/12/2012.          Sexually active: Yes.    H/O STD:  no  Health Maintenance: PCP:  Sela Hilding.  Last wellness appt was 01-28-2019.  Did blood work at that appt:  02-03-2019 Vaccines are up to date:  yes Colonoscopy:  2015 f/u 69yrs, Dr. Collene Mares MMG:  4/21 bilateral & left breast u/s category c density birads 2:neg BMD:  2020 pcp manages Last pap smear:  05-18-15 neg, 08-11-2018 neg HPV HR neg.   H/o abnormal pap smear:  yes    reports that she has quit smoking. She has never used smokeless tobacco. She reports current alcohol use of about 1.0 - 2.0 standard drink of alcohol per week. She reports that she does not use drugs.  Past Medical History:  Diagnosis Date  . Acid reflux   . Fibroid   . Thrombophlebitis 11/2000  . Thyroid nodule 09/2002    Past Surgical History:  Procedure Laterality Date  . BREAST EXCISIONAL BIOPSY Left   . BREAST EXCISIONAL BIOPSY Right   . DILATION AND CURETTAGE OF UTERUS  1994  . FOOT SURGERY Bilateral    Both feet operated on  . HYSTEROSCOPY  01/11/12   D&C  . KNEE SURGERY Left 2012  . MEDIAL PARTIAL KNEE REPLACEMENT Left 03/10/15  . THYROID LOBECTOMY Right 02/2003  . TONSILLECTOMY AND ADENOIDECTOMY      Current Outpatient Medications  Medication Sig Dispense Refill  . Acetaminophen (TYLENOL PO) Take 500 mg by mouth as needed.     Marland Kitchen alendronate (FOSAMAX) 70 MG tablet Take 70 mg by mouth once a week. Take with a full glass of water on an empty stomach.    Marland Kitchen amoxicillin (AMOXIL) 500 MG capsule  Take 500 mg by mouth as needed. One hour prior to dental work    . celecoxib (CELEBREX) 200 MG capsule Take 200 mg by mouth 3 (three) times a week.  0  . Coenzyme Q10 (COQ10 PO) Take 200 mg by mouth daily.     . fluticasone (FLONASE) 50 MCG/ACT nasal spray Place into both nostrils daily.    Marland Kitchen GLUCOSAMINE-CHONDROITIN PO Take 1,500 mg by mouth daily. Dose: 1500mg (glucosamine)-1200mg (chondroitin)    . Melatonin 1 MG TABS Take 1 mg by mouth daily. Chewable tab    . METRONIDAZOLE, TOPICAL, 0.75 % LOTN as needed.   3  . mometasone (ELOCON) 0.1 % cream Apply 1 application topically as needed.     . Multiple Vitamins-Minerals (MULTIVITAMIN PO) Take by mouth.    Marland Kitchen omeprazole (PRILOSEC) 40 MG capsule daily.    . rosuvastatin (CRESTOR) 5 MG tablet Take 5 mg by mouth 4 (four) times a week.     Marland Kitchen SYNTHROID 75 MCG tablet daily.     . TURMERIC PO Take 1,000 mg by mouth. + curcumin    . Vitamin D, Ergocalciferol, (DRISDOL) 50000 units CAPS capsule Take by mouth. Every 3 weeks  3   No current facility-administered medications for this visit.    Family History  Problem Relation  Age of Onset  . Cancer Father        Esophageal  . Heart disease Mother        "Angina"    Review of Systems  Genitourinary:       Left breast nipple pain  All other systems reviewed and are negative.   Exam:   BP 132/84 (BP Location: Right Arm, Patient Position: Sitting, Cuff Size: Large)   Pulse 70   Resp 14   Ht 5\' 1"  (1.549 m)   Wt 176 lb (79.8 kg)   LMP 09/12/2012   BMI 33.25 kg/m   Height: 5\' 1"  (154.9 cm)  General appearance: alert, cooperative and appears stated age Breasts: normal appearance, no masses or tenderness Abdomen: soft, non-tender; bowel sounds normal; no masses,  no organomegaly Lymph nodes: Cervical, supraclavicular, and axillary nodes normal.  No abnormal inguinal nodes palpated Neurologic: Grossly normal  Pelvic: External genitalia:  no lesions              Urethra:  normal appearing  urethra with no masses, tenderness or lesions              Bartholins and Skenes: normal                 Vagina: normal appearing vagina with normal color and discharge, no lesions              Cervix: no lesions              Pap taken: No. Bimanual Exam:  Uterus:  normal size, contour, position, consistency, mobility, non-tender              Adnexa: normal adnexa and no mass, fullness, tenderness               Rectovaginal: Confirms               Anus:  normal sphincter tone, no lesions  Chaperone, Royal Hawthorn, CMA, was present for exam.  A:  Breast and Pelvic exam PMP, no HRT H/o thyroid nodule, s/p resection, now on thyroid medication Nipple pain/irritation, normal exam today  P:   Mammogram guidelines reviewed.  Do not feel needs diagnostic testing due to recent nipple issue.  She is advised to call with any new changes/concerns.  Would consider diagnostic testing then. pap smear neg 2020, not indicated today Lab work and vaccines up to date with Dr. Lindell Noe D/w pt follow up wellness exams and literature regarding this.  She is going to return in 2 years and call with any new concerns.  26 minutes spent in total with pt

## 2019-10-19 ENCOUNTER — Other Ambulatory Visit: Payer: Self-pay

## 2019-10-19 ENCOUNTER — Ambulatory Visit (INDEPENDENT_AMBULATORY_CARE_PROVIDER_SITE_OTHER): Payer: Medicare PPO | Admitting: Obstetrics & Gynecology

## 2019-10-19 ENCOUNTER — Encounter: Payer: Self-pay | Admitting: Obstetrics & Gynecology

## 2019-10-19 VITALS — BP 132/84 | HR 70 | Resp 14 | Ht 61.0 in | Wt 176.0 lb

## 2019-10-19 DIAGNOSIS — M810 Age-related osteoporosis without current pathological fracture: Secondary | ICD-10-CM

## 2019-10-19 DIAGNOSIS — Z01419 Encounter for gynecological examination (general) (routine) without abnormal findings: Secondary | ICD-10-CM

## 2019-10-19 DIAGNOSIS — N644 Mastodynia: Secondary | ICD-10-CM | POA: Diagnosis not present

## 2020-04-27 ENCOUNTER — Other Ambulatory Visit: Payer: Self-pay | Admitting: Family Medicine

## 2020-04-27 DIAGNOSIS — Z1231 Encounter for screening mammogram for malignant neoplasm of breast: Secondary | ICD-10-CM

## 2020-05-27 ENCOUNTER — Ambulatory Visit: Payer: Medicare PPO | Attending: Internal Medicine

## 2020-05-27 ENCOUNTER — Other Ambulatory Visit: Payer: Self-pay

## 2020-05-27 DIAGNOSIS — Z23 Encounter for immunization: Secondary | ICD-10-CM

## 2020-05-27 NOTE — Progress Notes (Signed)
   Covid-19 Vaccination Clinic  Name:  Christie Beasley    MRN: [id number] DOB: May 04, 1950  05/27/2020  [name] was observed post Covid-19 immunization for 15 minutes without incident. She was provided with Vaccine Information Sheet and instruction to access the V-Safe system.   [name] was instructed to call 911 with any severe reactions post vaccine: Marland Kitchen Difficulty breathing  . Swelling of face and throat  . A fast heartbeat  . A bad rash all over body  . Dizziness and weakness   Immunizations Administered    Name Date Dose VIS Date Route   Moderna Covid-19 Booster Vaccine 05/27/2020 11:23 AM 0.25 mL 12/09/2019 Intramuscular   Manufacturer: Levan Hurst   Lot: [id number]   [place]: [id number]

## 2020-06-03 ENCOUNTER — Other Ambulatory Visit (HOSPITAL_BASED_OUTPATIENT_CLINIC_OR_DEPARTMENT_OTHER): Payer: Self-pay

## 2020-06-03 MED ORDER — COVID-19 MRNA VACC (MODERNA) 100 MCG/0.5ML IM SUSP
INTRAMUSCULAR | 0 refills | Status: DC
  Filled 2020-06-03: qty 0.25, 1d supply, fill #0

## 2020-06-09 ENCOUNTER — Ambulatory Visit: Payer: Medicare PPO

## 2020-06-23 DIAGNOSIS — K219 Gastro-esophageal reflux disease without esophagitis: Secondary | ICD-10-CM | POA: Diagnosis not present

## 2020-06-23 DIAGNOSIS — E669 Obesity, unspecified: Secondary | ICD-10-CM | POA: Diagnosis not present

## 2020-06-23 DIAGNOSIS — K449 Diaphragmatic hernia without obstruction or gangrene: Secondary | ICD-10-CM | POA: Diagnosis not present

## 2020-07-12 DIAGNOSIS — L821 Other seborrheic keratosis: Secondary | ICD-10-CM | POA: Diagnosis not present

## 2020-07-12 DIAGNOSIS — D1801 Hemangioma of skin and subcutaneous tissue: Secondary | ICD-10-CM | POA: Diagnosis not present

## 2020-07-12 DIAGNOSIS — D2361 Other benign neoplasm of skin of right upper limb, including shoulder: Secondary | ICD-10-CM | POA: Diagnosis not present

## 2020-07-12 DIAGNOSIS — L723 Sebaceous cyst: Secondary | ICD-10-CM | POA: Diagnosis not present

## 2020-07-12 DIAGNOSIS — D225 Melanocytic nevi of trunk: Secondary | ICD-10-CM | POA: Diagnosis not present

## 2020-07-12 DIAGNOSIS — D2221 Melanocytic nevi of right ear and external auricular canal: Secondary | ICD-10-CM | POA: Diagnosis not present

## 2020-07-12 DIAGNOSIS — I788 Other diseases of capillaries: Secondary | ICD-10-CM | POA: Diagnosis not present

## 2020-07-19 ENCOUNTER — Ambulatory Visit
Admission: RE | Admit: 2020-07-19 | Discharge: 2020-07-19 | Disposition: A | Discharge status: Home / Self Care | Payer: Medicare PPO | Source: Ambulatory Visit | Attending: Family Medicine | Admitting: Family Medicine

## 2020-07-19 ENCOUNTER — Other Ambulatory Visit: Payer: Self-pay

## 2020-07-19 DIAGNOSIS — Z1231 Encounter for screening mammogram for malignant neoplasm of breast: Secondary | ICD-10-CM | POA: Diagnosis not present

## 2020-11-10 DIAGNOSIS — Z23 Encounter for immunization: Secondary | ICD-10-CM | POA: Diagnosis not present

## 2020-11-23 DIAGNOSIS — H2513 Age-related nuclear cataract, bilateral: Secondary | ICD-10-CM | POA: Diagnosis not present

## 2020-11-23 DIAGNOSIS — H524 Presbyopia: Secondary | ICD-10-CM | POA: Diagnosis not present

## 2020-11-23 DIAGNOSIS — H52223 Regular astigmatism, bilateral: Secondary | ICD-10-CM | POA: Diagnosis not present

## 2020-11-23 DIAGNOSIS — H5203 Hypermetropia, bilateral: Secondary | ICD-10-CM | POA: Diagnosis not present

## 2020-11-30 ENCOUNTER — Other Ambulatory Visit (HOSPITAL_BASED_OUTPATIENT_CLINIC_OR_DEPARTMENT_OTHER): Payer: Self-pay

## 2020-11-30 ENCOUNTER — Telehealth (HOSPITAL_BASED_OUTPATIENT_CLINIC_OR_DEPARTMENT_OTHER): Payer: Self-pay | Admitting: Pharmacist

## 2020-11-30 NOTE — Telephone Encounter (Signed)
Medication reconciliation completed at [facility].  Harriet Pho, PharmD Clinical Pharmacist [facility]  11/30/2020 10:07 AM

## 2020-12-05 ENCOUNTER — Other Ambulatory Visit (HOSPITAL_BASED_OUTPATIENT_CLINIC_OR_DEPARTMENT_OTHER): Payer: Self-pay

## 2020-12-05 ENCOUNTER — Other Ambulatory Visit: Payer: Self-pay

## 2020-12-05 ENCOUNTER — Ambulatory Visit: Payer: Medicare PPO | Attending: Internal Medicine

## 2020-12-05 DIAGNOSIS — Z23 Encounter for immunization: Secondary | ICD-10-CM

## 2020-12-05 MED ORDER — MODERNA COVID-19 BIVAL BOOSTER 50 MCG/0.5ML IM SUSP
INTRAMUSCULAR | 0 refills | Status: AC
  Filled 2020-12-05: qty 0.5, 1d supply, fill #0

## 2020-12-05 NOTE — Progress Notes (Signed)
   Covid-19 Vaccination Clinic  Name:  Christie Beasley    MRN: [id number] DOB: [birth year]  12/05/2020  Christie Beasley was observed post Covid-19 immunization for 15 minutes without incident. She was provided with Vaccine Information Sheet and instruction to access the V-Safe system.   Christie Beasley was instructed to call 911 with any severe reactions post vaccine: Difficulty breathing  Swelling of face and throat  A fast heartbeat  A bad rash all over body  Dizziness and weakness

## 2020-12-18 IMAGING — MG MM DIGITAL DIAGNOSTIC UNILAT*L* W/ TOMO W/ CAD
6 series · 6 of 18 positions shown · non-contrast
Comparison: Previous exam(s).

CLINICAL DATA: Patient recalled from screening for left breast
asymmetry.

EXAM:
DIGITAL DIAGNOSTIC LEFT MAMMOGRAM WITH CAD AND TOMO
ULTRASOUND LEFT BREAST

[L ML synth-2D]
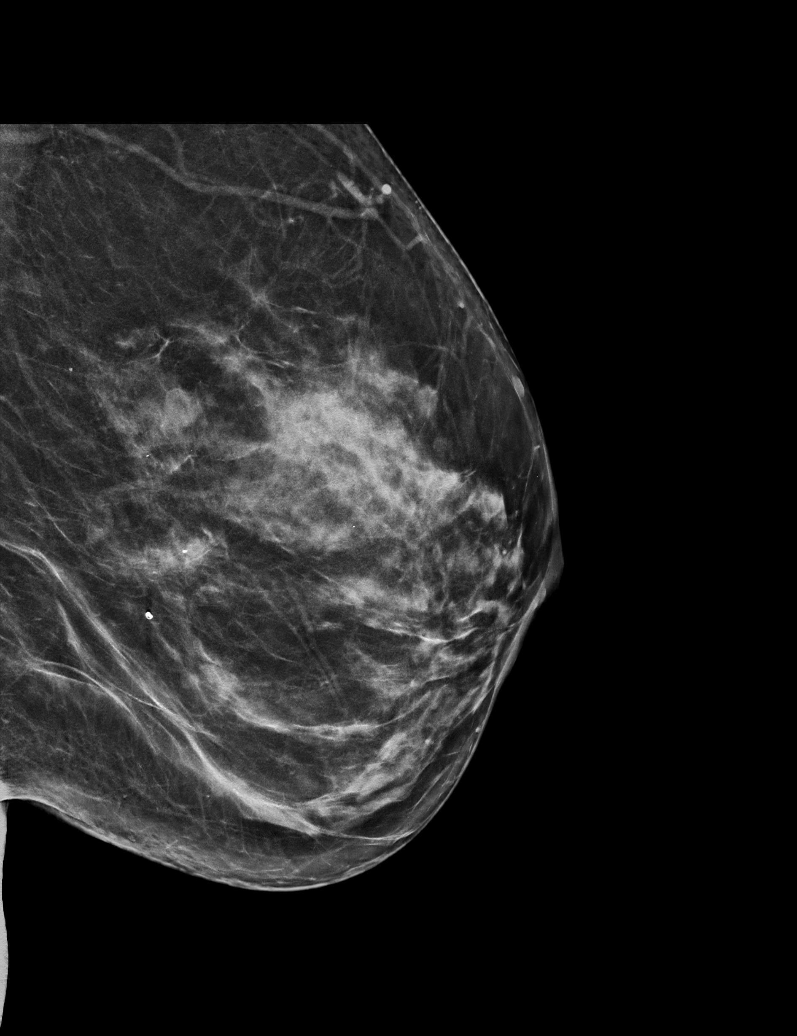

[L CC synth-2D (1 of 2)]
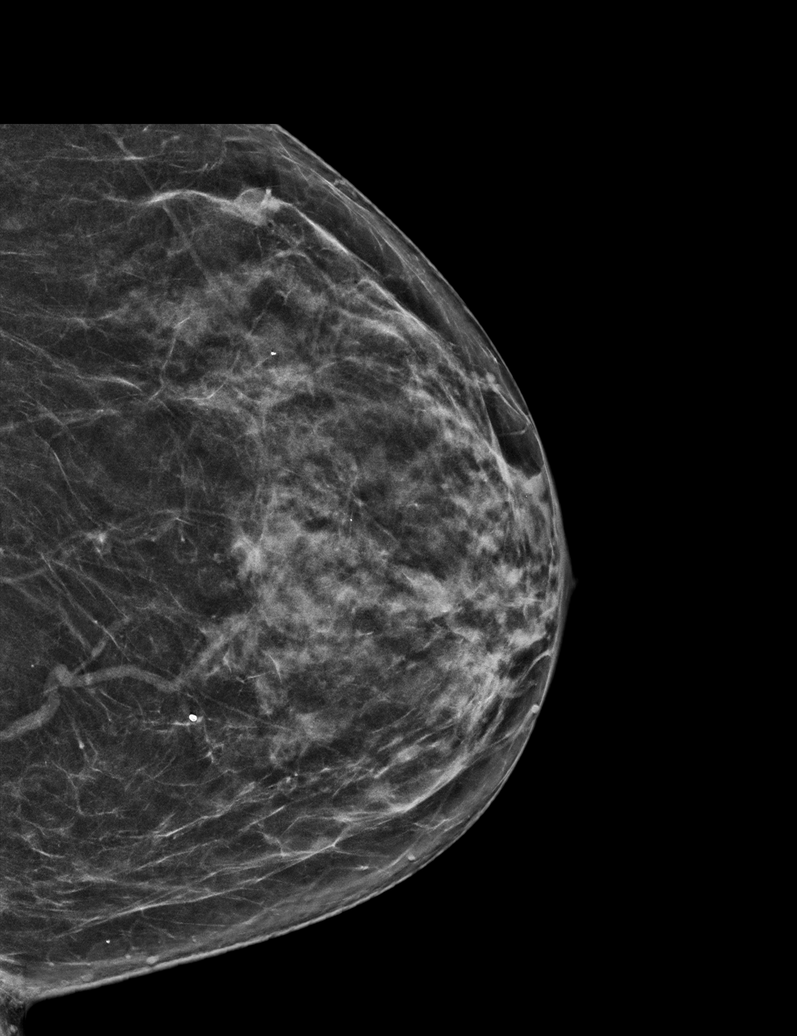

[L CC synth-2D (2 of 2)]
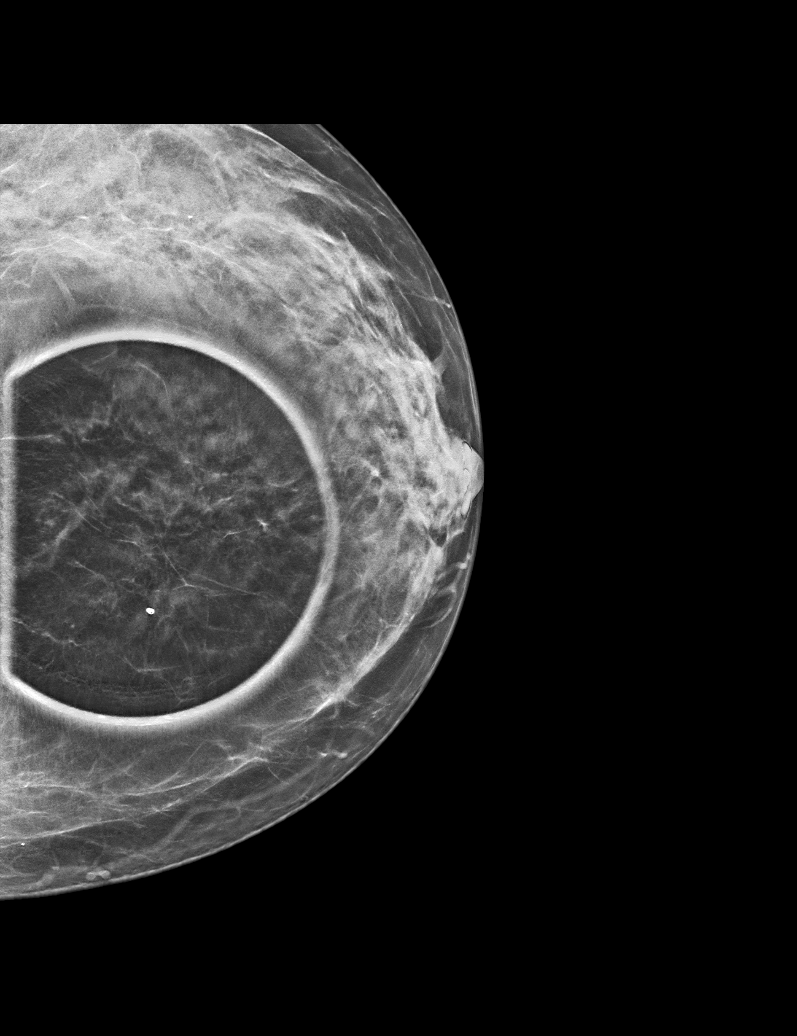

[L ML tomo · tomo slice 35/69.0]
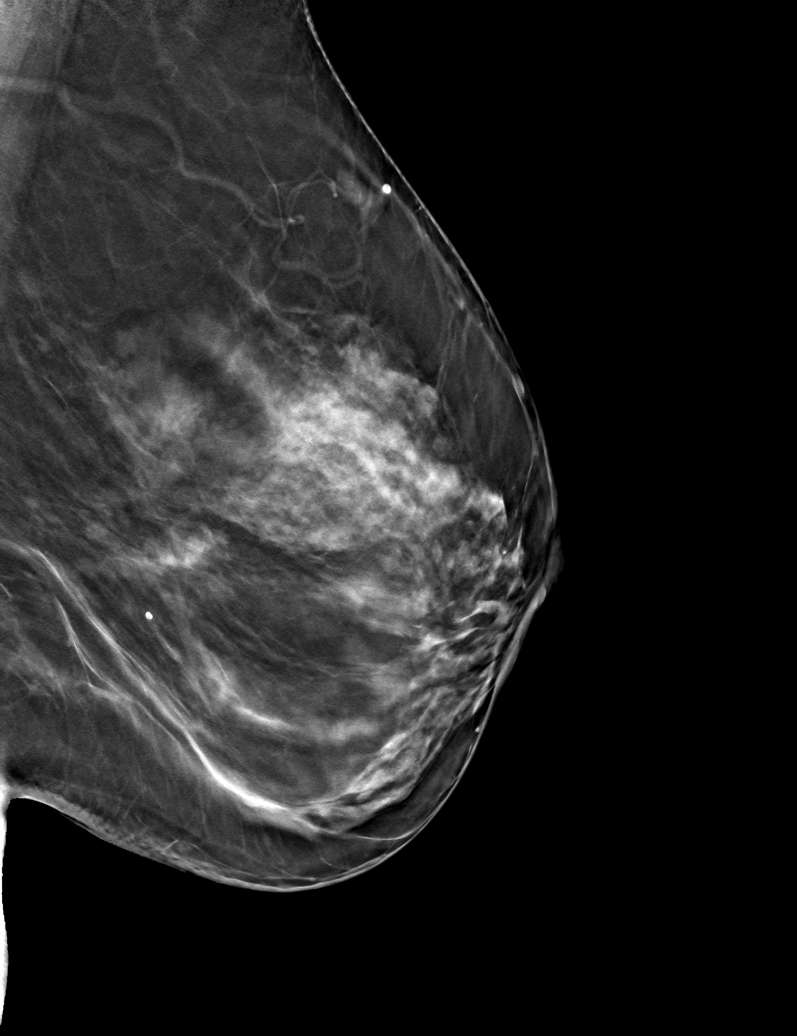

[L CC tomo (1 of 2) · tomo slice 25/50.0]
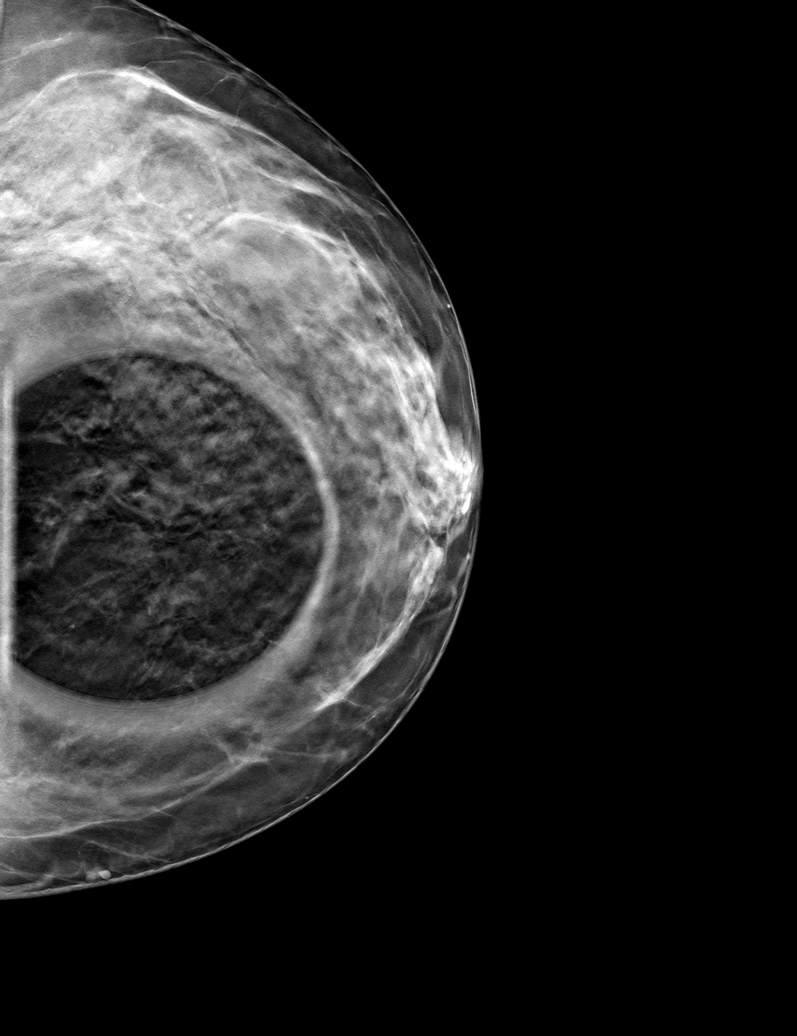

[L CC tomo (2 of 2) · tomo slice 31/62.0]
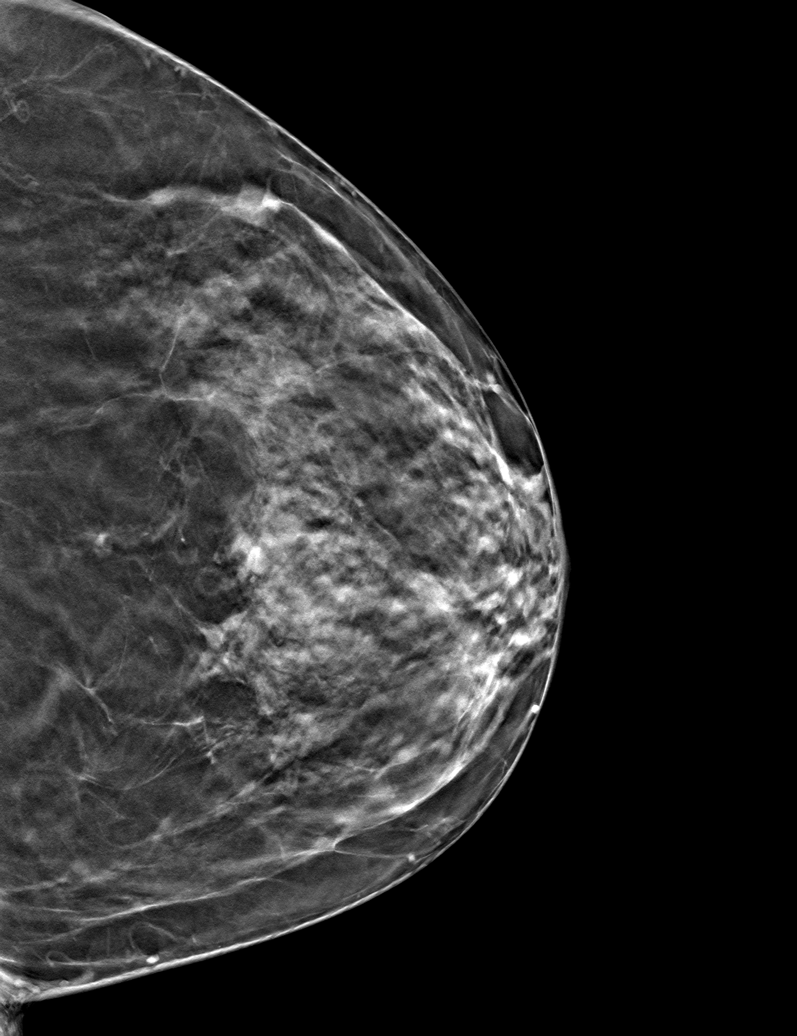

[6 of 18 positions shown; findings below may reference images not displayed]

ACR Breast Density Category c: The breast tissue is heterogeneously
dense, which may obscure small masses.
FINDINGS: Questioned asymmetry within the central aspect of the left breast on
CC view predominately resolved with additional imaging suggestive of
dense fibroglandular tissue.

Mammographic images were processed with CAD.

Targeted ultrasound is performed, showing normal dense tissue
without suspicious mass within the left breast 11-1 o'clock
position.
IMPRESSION: No mammographic evidence for malignancy.

RECOMMENDATION:
Screening mammogram in one year.(Code:[id number])

I have discussed the findings and recommendations with the patient.
If applicable, a reminder letter will be sent to the patient
regarding the next appointment.

BI-RADS CATEGORY  2: Benign.

## 2020-12-28 DIAGNOSIS — M722 Plantar fascial fibromatosis: Secondary | ICD-10-CM | POA: Diagnosis not present

## 2021-02-09 DIAGNOSIS — E039 Hypothyroidism, unspecified: Secondary | ICD-10-CM | POA: Diagnosis not present

## 2021-02-09 DIAGNOSIS — Z79899 Other long term (current) drug therapy: Secondary | ICD-10-CM | POA: Diagnosis not present

## 2021-02-09 DIAGNOSIS — E7800 Pure hypercholesterolemia, unspecified: Secondary | ICD-10-CM | POA: Diagnosis not present

## 2021-02-09 DIAGNOSIS — E559 Vitamin D deficiency, unspecified: Secondary | ICD-10-CM | POA: Diagnosis not present

## 2021-02-14 DIAGNOSIS — M810 Age-related osteoporosis without current pathological fracture: Secondary | ICD-10-CM | POA: Diagnosis not present

## 2021-02-14 DIAGNOSIS — E7800 Pure hypercholesterolemia, unspecified: Secondary | ICD-10-CM | POA: Diagnosis not present

## 2021-02-14 DIAGNOSIS — J309 Allergic rhinitis, unspecified: Secondary | ICD-10-CM | POA: Diagnosis not present

## 2021-02-14 DIAGNOSIS — Z79899 Other long term (current) drug therapy: Secondary | ICD-10-CM | POA: Diagnosis not present

## 2021-02-14 DIAGNOSIS — L089 Local infection of the skin and subcutaneous tissue, unspecified: Secondary | ICD-10-CM | POA: Diagnosis not present

## 2021-02-14 DIAGNOSIS — Z0000 Encounter for general adult medical examination without abnormal findings: Secondary | ICD-10-CM | POA: Diagnosis not present

## 2021-02-14 DIAGNOSIS — E039 Hypothyroidism, unspecified: Secondary | ICD-10-CM | POA: Diagnosis not present

## 2021-02-14 DIAGNOSIS — E559 Vitamin D deficiency, unspecified: Secondary | ICD-10-CM | POA: Diagnosis not present

## 2021-03-22 DIAGNOSIS — R229 Localized swelling, mass and lump, unspecified: Secondary | ICD-10-CM | POA: Diagnosis not present

## 2021-04-12 DIAGNOSIS — L700 Acne vulgaris: Secondary | ICD-10-CM | POA: Diagnosis not present

## 2021-05-23 ENCOUNTER — Other Ambulatory Visit (HOSPITAL_BASED_OUTPATIENT_CLINIC_OR_DEPARTMENT_OTHER): Payer: Self-pay

## 2021-06-06 ENCOUNTER — Other Ambulatory Visit: Payer: Self-pay | Admitting: Family Medicine

## 2021-06-06 DIAGNOSIS — Z1231 Encounter for screening mammogram for malignant neoplasm of breast: Secondary | ICD-10-CM

## 2021-06-09 ENCOUNTER — Other Ambulatory Visit: Payer: Self-pay | Admitting: Family Medicine

## 2021-06-09 DIAGNOSIS — E2839 Other primary ovarian failure: Secondary | ICD-10-CM

## 2021-07-13 ENCOUNTER — Ambulatory Visit: Payer: Medicare PPO | Attending: Internal Medicine

## 2021-07-13 DIAGNOSIS — Z23 Encounter for immunization: Secondary | ICD-10-CM

## 2021-07-14 NOTE — Progress Notes (Signed)
   Covid-19 Vaccination Clinic  Name:  [name]    MRN: [id number] DOB: 27-May-1950  07/14/2021  [name] was observed post Covid-19 immunization for 15 minutes without incident. She was provided with Vaccine Information Sheet and instruction to access the V-Safe system.   [name] was instructed to call 911 with any severe reactions post vaccine: Difficulty breathing  Swelling of face and throat  A fast heartbeat  A bad rash all over body  Dizziness and weakness   Immunizations Administered     Name Date Dose VIS Date Route   Moderna Covid-19 vaccine Bivalent Booster 07/13/2021  2:19 PM 0.5 mL 10/01/2020 Intramuscular   Manufacturer: Levan Hurst   Lot: [id number]   [place]: [phone]

## 2021-07-20 ENCOUNTER — Other Ambulatory Visit (HOSPITAL_BASED_OUTPATIENT_CLINIC_OR_DEPARTMENT_OTHER): Payer: Self-pay

## 2021-07-26 ENCOUNTER — Ambulatory Visit
Admission: RE | Admit: 2021-07-26 | Discharge: 2021-07-26 | Disposition: A | Discharge status: Home / Self Care | Payer: Medicare PPO | Source: Ambulatory Visit | Attending: Family Medicine | Admitting: Family Medicine

## 2021-07-26 DIAGNOSIS — Z1231 Encounter for screening mammogram for malignant neoplasm of breast: Secondary | ICD-10-CM | POA: Diagnosis not present

## 2021-07-28 DIAGNOSIS — Z96652 Presence of left artificial knee joint: Secondary | ICD-10-CM | POA: Diagnosis not present

## 2021-07-28 DIAGNOSIS — M1711 Unilateral primary osteoarthritis, right knee: Secondary | ICD-10-CM | POA: Diagnosis not present

## 2021-07-28 DIAGNOSIS — M2242 Chondromalacia patellae, left knee: Secondary | ICD-10-CM | POA: Diagnosis not present

## 2021-08-14 DIAGNOSIS — D2221 Melanocytic nevi of right ear and external auricular canal: Secondary | ICD-10-CM | POA: Diagnosis not present

## 2021-08-14 DIAGNOSIS — D225 Melanocytic nevi of trunk: Secondary | ICD-10-CM | POA: Diagnosis not present

## 2021-08-14 DIAGNOSIS — I788 Other diseases of capillaries: Secondary | ICD-10-CM | POA: Diagnosis not present

## 2021-08-14 DIAGNOSIS — L738 Other specified follicular disorders: Secondary | ICD-10-CM | POA: Diagnosis not present

## 2021-08-14 DIAGNOSIS — L821 Other seborrheic keratosis: Secondary | ICD-10-CM | POA: Diagnosis not present

## 2021-08-14 DIAGNOSIS — L718 Other rosacea: Secondary | ICD-10-CM | POA: Diagnosis not present

## 2021-08-14 DIAGNOSIS — L720 Epidermal cyst: Secondary | ICD-10-CM | POA: Diagnosis not present

## 2021-08-14 DIAGNOSIS — D1801 Hemangioma of skin and subcutaneous tissue: Secondary | ICD-10-CM | POA: Diagnosis not present

## 2021-11-03 DIAGNOSIS — Z6833 Body mass index (BMI) 33.0-33.9, adult: Secondary | ICD-10-CM | POA: Diagnosis not present

## 2021-11-03 DIAGNOSIS — K219 Gastro-esophageal reflux disease without esophagitis: Secondary | ICD-10-CM | POA: Diagnosis not present

## 2021-11-03 DIAGNOSIS — E785 Hyperlipidemia, unspecified: Secondary | ICD-10-CM | POA: Diagnosis not present

## 2021-11-03 DIAGNOSIS — J309 Allergic rhinitis, unspecified: Secondary | ICD-10-CM | POA: Diagnosis not present

## 2021-11-03 DIAGNOSIS — E559 Vitamin D deficiency, unspecified: Secondary | ICD-10-CM | POA: Diagnosis not present

## 2021-11-03 DIAGNOSIS — E669 Obesity, unspecified: Secondary | ICD-10-CM | POA: Diagnosis not present

## 2021-11-03 DIAGNOSIS — M1990 Unspecified osteoarthritis, unspecified site: Secondary | ICD-10-CM | POA: Diagnosis not present

## 2021-11-03 DIAGNOSIS — E039 Hypothyroidism, unspecified: Secondary | ICD-10-CM | POA: Diagnosis not present

## 2021-11-03 DIAGNOSIS — R030 Elevated blood-pressure reading, without diagnosis of hypertension: Secondary | ICD-10-CM | POA: Diagnosis not present

## 2021-11-06 DIAGNOSIS — Z23 Encounter for immunization: Secondary | ICD-10-CM | POA: Diagnosis not present

## 2021-11-23 DIAGNOSIS — H53143 Visual discomfort, bilateral: Secondary | ICD-10-CM | POA: Diagnosis not present

## 2021-11-23 DIAGNOSIS — H5203 Hypermetropia, bilateral: Secondary | ICD-10-CM | POA: Diagnosis not present

## 2021-11-23 DIAGNOSIS — H524 Presbyopia: Secondary | ICD-10-CM | POA: Diagnosis not present

## 2021-11-23 DIAGNOSIS — H52223 Regular astigmatism, bilateral: Secondary | ICD-10-CM | POA: Diagnosis not present

## 2021-11-23 DIAGNOSIS — H2513 Age-related nuclear cataract, bilateral: Secondary | ICD-10-CM | POA: Diagnosis not present

## 2021-11-28 ENCOUNTER — Ambulatory Visit
Admission: RE | Admit: 2021-11-28 | Discharge: 2021-11-28 | Disposition: A | Discharge status: Home / Self Care | Payer: Medicare PPO | Source: Ambulatory Visit | Attending: Family Medicine | Admitting: Family Medicine

## 2021-11-28 DIAGNOSIS — Z780 Asymptomatic menopausal state: Secondary | ICD-10-CM | POA: Diagnosis not present

## 2021-11-28 DIAGNOSIS — M810 Age-related osteoporosis without current pathological fracture: Secondary | ICD-10-CM | POA: Diagnosis not present

## 2021-11-28 DIAGNOSIS — E2839 Other primary ovarian failure: Secondary | ICD-10-CM

## 2022-01-15 ENCOUNTER — Ambulatory Visit (HOSPITAL_BASED_OUTPATIENT_CLINIC_OR_DEPARTMENT_OTHER): Payer: Medicare PPO | Admitting: Obstetrics & Gynecology

## 2022-02-05 ENCOUNTER — Other Ambulatory Visit (HOSPITAL_BASED_OUTPATIENT_CLINIC_OR_DEPARTMENT_OTHER): Payer: Self-pay

## 2022-02-05 MED ORDER — COMIRNATY 30 MCG/0.3ML IM SUSY
PREFILLED_SYRINGE | INTRAMUSCULAR | 0 refills | Status: AC
  Filled 2022-02-05: qty 0.3, 1d supply, fill #0

## 2022-02-16 DIAGNOSIS — M810 Age-related osteoporosis without current pathological fracture: Secondary | ICD-10-CM | POA: Diagnosis not present

## 2022-02-16 DIAGNOSIS — E559 Vitamin D deficiency, unspecified: Secondary | ICD-10-CM | POA: Diagnosis not present

## 2022-02-16 DIAGNOSIS — E7800 Pure hypercholesterolemia, unspecified: Secondary | ICD-10-CM | POA: Diagnosis not present

## 2022-02-16 DIAGNOSIS — R350 Frequency of micturition: Secondary | ICD-10-CM | POA: Diagnosis not present

## 2022-02-16 DIAGNOSIS — F5101 Primary insomnia: Secondary | ICD-10-CM | POA: Diagnosis not present

## 2022-02-16 DIAGNOSIS — Z0000 Encounter for general adult medical examination without abnormal findings: Secondary | ICD-10-CM | POA: Diagnosis not present

## 2022-02-16 DIAGNOSIS — Z79899 Other long term (current) drug therapy: Secondary | ICD-10-CM | POA: Diagnosis not present

## 2022-02-16 DIAGNOSIS — J309 Allergic rhinitis, unspecified: Secondary | ICD-10-CM | POA: Diagnosis not present

## 2022-02-16 DIAGNOSIS — E039 Hypothyroidism, unspecified: Secondary | ICD-10-CM | POA: Diagnosis not present

## 2022-02-16 DIAGNOSIS — R7301 Impaired fasting glucose: Secondary | ICD-10-CM | POA: Diagnosis not present

## 2022-02-24 NOTE — Progress Notes (Unsigned)
Cardiology Office Note   Date:  02/27/2022   ID:  Sharol, Croghan 03/11/1950, MRN [id number]  PCP:  Glenis Smoker, MD  Cardiologist:   Minus Breeding, MD    Chief Complaint  Patient presents with   Dyslipidemia      History of Present Illness: Christie Beasley is a 72 y.o. female who presents for was referred by Glenis Smoker, MD for evaluation of palpitations.  I last saw her in 2021.  She is not bothered by the palpitations but was referred back here because her lipids are not well-controlled.  She is been able to tolerate a very low-dose of statin in the past.  Her LDL recently was 132 with a total cholesterol 213 and HDL of 54.  She is limited a little bit by joint problems but she has been able to do some chair aerobics and some water aerobics.  She does not get chest pressure, neck or arm discomfort.  She does not have shortness of breath, PND or orthopnea.  She has not had any palpitations.  She has had no weight gain or edema.   She does have a history of syncope several times over the years.  There is usually an initiating event such as standing in a hot music venue.  Most recently she was on a cruise and had COVID and stood up too quickly.   Past Medical History:  Diagnosis Date   Acid reflux    Elevated lipids    Fibroid    History of fainting    pt reports she is a "fainter".     Rosacea    Thrombophlebitis 11/2000   Thyroid nodule 09/2002    Past Surgical History:  Procedure Laterality Date   BREAST EXCISIONAL BIOPSY Left    BREAST EXCISIONAL BIOPSY Right    DILATION AND CURETTAGE OF UTERUS  1994   FOOT SURGERY Bilateral    hammer toes   HYSTEROSCOPY  01/11/12   D&C   KNEE SURGERY Left 2012   MEDIAL PARTIAL KNEE REPLACEMENT Left 03/10/15   THYROID LOBECTOMY Right 02/2003   TONSILLECTOMY AND ADENOIDECTOMY       Current Outpatient Medications  Medication Sig Dispense Refill   traZODone (DESYREL) 50 MG tablet Take  50-100 mg by mouth at bedtime as needed.     Acetaminophen (TYLENOL PO) Take 500 mg by mouth as needed.      amoxicillin (AMOXIL) 500 MG capsule Take 500 mg by mouth as needed. One hour prior to dental work     celecoxib (CELEBREX) 200 MG capsule Take 200 mg by mouth 3 (three) times a week.  0   Coenzyme Q10 (COQ10 PO) Take 200 mg by mouth daily.      COVID-19 mRNA bivalent vaccine, Moderna, (MODERNA COVID-19 BIVAL BOOSTER) 50 MCG/0.5ML injection Inject into the muscle. 0.5 mL 0   COVID-19 mRNA vaccine 2023-2024 (COMIRNATY) syringe Inject into the muscle. 0.3 mL 0   fluticasone (FLONASE) 50 MCG/ACT nasal spray Place into both nostrils daily.     GLUCOSAMINE-CHONDROITIN PO Take 1,500 mg by mouth daily. Dose: '1500mg'$ (glucosamine)-'1200mg'$ (chondroitin)     Melatonin 1 MG TABS Take 2 mg by mouth at bedtime. Chewable tab     METRONIDAZOLE, TOPICAL, 0.75 % LOTN as needed.   3   mometasone (ELOCON) 0.1 % cream Apply 1 application topically as needed.      Multiple Vitamins-Minerals (MULTIVITAMIN PO) Taking by mouth at bedtime.     omeprazole (  PRILOSEC) 40 MG capsule Take 40 mg by mouth daily.     rosuvastatin (CRESTOR) 5 MG tablet Take 5 mg by mouth 3 (three) times a week. At bedtime.     SYNTHROID 75 MCG tablet Take 75 mcg by mouth daily before breakfast.     TURMERIC PO Take 1,000 mg by mouth. + curcumin     Vitamin D, Ergocalciferol, (DRISDOL) 50000 units CAPS capsule Take by mouth. Every 3 weeks  3   No current facility-administered medications for this visit.    Allergies:   Lipitor  [atorvastatin calcium]    ROS:  Please see the history of present illness.   Otherwise, review of systems are positive for none.   All other systems are reviewed and negative.    PHYSICAL EXAM: VS:  BP 124/86   Pulse 73   Ht '5\' 1"'$  (1.549 m)   Wt 176 lb 12.8 oz (80.2 kg)   LMP 02/19/2001   SpO2 96%   BMI 33.41 kg/m  , BMI Body mass index is 33.41 kg/m. GENERAL:  Well appearing NECK:  No jugular venous  distention, waveform within normal limits, carotid upstroke brisk and symmetric, no bruits, no thyromegaly LUNGS:  Clear to auscultation bilaterally CHEST:  Unremarkable HEART:  PMI not displaced or sustained,S1 and S2 within normal limits, no S3, no S4, no clicks, no rubs, no murmurs ABD:  Flat, positive bowel sounds normal in frequency in pitch, no bruits, no rebound, no guarding, no midline pulsatile mass, no hepatomegaly, no splenomegaly EXT:  2 plus pulses throughout, no edema, no cyanosis no clubbing  EKG:  EKG is  ordered today. Sinus rhythm, rate 73, poor anterior R wave progression, inferior Q waves unchanged from previous, no acute ST-T wave changes.   Recent Labs: No results found for requested labs within last 365 days.    Lipid Panel No results found for: "CHOL", "TRIG", "HDL", "CHOLHDL", "VLDL", "LDLCALC", "LDLDIRECT"    Wt Readings from Last 3 Encounters:  02/27/22 176 lb 12.8 oz (80.2 kg)  10/19/19 176 lb (79.8 kg)  07/08/19 178 lb 9.6 oz (81 kg)      Other studies Reviewed: Additional studies/ records that were reviewed today include: Labs. Review of the above records demonstrates:  Please see elsewhere in the note.     ASSESSMENT AND PLAN:  DYSLIPIDEMIA: The patient does not tolerate high-dose statins.  I think goals of therapy can best be established by starting with a coronary calcium score.  She does not have otherwise a lot of risk factors.  She will increase from 3 times a week to 4 times a week her statin and then we can proceed from there based on the results of the score.  We did talk about a plant-based fish heavy diet.  SYNCOPE: She has had syncope that has always had a trigger.  I would not suspect arrhythmia genic or vasodepressive.  No further workup.  ABNORMAL EKG: I did compare this to an EKG from 2016.  Q waves are not diagnostic of an inferior infarct.  If she is low threshold to have obstructive coronary disease or previous silent MI.  Workup  as above.  Current medicines are reviewed at length with the patient today.  The patient does not have concerns regarding medicines.  The following changes have been made:  As above  Labs/ tests ordered today include:      Orders Placed This Encounter  Procedures   CT CARDIAC SCORING (SELF PAY ONLY)  EKG 12-Lead     Disposition:   FU with me as needed.    Signed, Minus Breeding, MD  02/27/2022 3:58 PM    [place]

## 2022-02-27 ENCOUNTER — Ambulatory Visit: Payer: Medicare PPO | Attending: Cardiovascular Disease | Admitting: Cardiology

## 2022-02-27 ENCOUNTER — Encounter: Payer: Self-pay | Admitting: Cardiology

## 2022-02-27 VITALS — BP 124/86 | HR 73 | Ht 61.0 in | Wt 176.8 lb

## 2022-02-27 DIAGNOSIS — R9431 Abnormal electrocardiogram [ECG] [EKG]: Secondary | ICD-10-CM

## 2022-02-27 DIAGNOSIS — R002 Palpitations: Secondary | ICD-10-CM | POA: Diagnosis not present

## 2022-02-27 NOTE — Patient Instructions (Signed)
Medication Instructions:  Your physician recommends that you continue on your current medications as directed. Please refer to the Current Medication list given to you today.  *If you need a refill on your cardiac medications before your next appointment, please call your pharmacy*  Lab Work: NONE ordered at this time of appointment   If you have labs (blood work) drawn today and your tests are completely normal, you will receive your results only by: [place] (if you have MyChart) OR A paper copy in the mail If you have any lab test that is abnormal or we need to change your treatment, we will call you to review the results.  Testing/Procedures: Dr. Percival Spanish has ordered a CT coronary calcium score.   Test locations:  [place]   This is $99 out of pocket.   Coronary CalciumScan A coronary calcium scan is an imaging test used to look for deposits of calcium and other fatty materials (plaques) in the inner lining of the blood vessels of the heart (coronary arteries). These deposits of calcium and plaques can partly clog and narrow the coronary arteries without producing any symptoms or warning signs. This puts a person at risk for a heart attack. This test can detect these deposits before symptoms develop. Tell a health care provider about: Any allergies you have. All medicines you are taking, including vitamins, herbs, eye drops, creams, and over-the-counter medicines. Any problems you or family members have had with anesthetic medicines. Any blood disorders you have. Any surgeries you have had. Any medical conditions you have. Whether you are pregnant or may be pregnant. What are the risks? Generally, this is a safe procedure. However, problems may occur, including: Harm to a pregnant woman and her unborn baby. This test involves the use of radiation. Radiation exposure can be dangerous to a pregnant woman and her unborn baby. If you are  pregnant, you generally should not have this procedure done. Slight increase in the risk of cancer. This is because of the radiation involved in the test. What happens before the procedure? No preparation is needed for this procedure. What happens during the procedure? You will undress and remove any jewelry around your neck or chest. You will put on a hospital gown. Sticky electrodes will be placed on your chest. The electrodes will be connected to an electrocardiogram (ECG) machine to record a tracing of the electrical activity of your heart. A CT scanner will take pictures of your heart. During this time, you will be asked to lie still and hold your breath for 2-3 seconds while a picture of your heart is being taken. The procedure may vary among health care providers and hospitals. What happens after the procedure? You can get dressed. You can return to your normal activities. It is up to you to get the results of your test. Ask your health care provider, or the department that is doing the test, when your results will be ready. Summary A coronary calcium scan is an imaging test used to look for deposits of calcium and other fatty materials (plaques) in the inner lining of the blood vessels of the heart (coronary arteries). Generally, this is a safe procedure. Tell your health care provider if you are pregnant or may be pregnant. No preparation is needed for this procedure. A CT scanner will take pictures of your heart. You can return to your normal activities after the scan is done. This information is not intended to replace advice given to  you by your health care provider. Make sure you discuss any questions you have with your health care provider. Document Released: 08/04/2007 Document Revised: 12/26/2015 Document Reviewed: 12/26/2015 Elsevier Interactive Patient Education  2017 [place]: At [facility], you and your health needs are our priority.  As part  of our continuing mission to provide you with exceptional heart care, we have created designated Provider Care Teams.  These Care Teams include your primary Cardiologist (physician) and Advanced Practice Providers (APPs -  Physician Assistants and Nurse Practitioners) who all work together to provide you with the care you need, when you need it.  Your next appointment:   As needed if symptoms worsen or fail to improve    The format for your next appointment:   In Person  Provider:   Minus Breeding, MD     Other Instructions  Important Information About [place]

## 2022-03-16 ENCOUNTER — Ambulatory Visit (HOSPITAL_BASED_OUTPATIENT_CLINIC_OR_DEPARTMENT_OTHER)
Admission: RE | Admit: 2022-03-16 | Discharge: 2022-03-16 | Disposition: A | Discharge status: Home / Self Care | Payer: Medicare PPO | Source: Ambulatory Visit | Attending: Cardiology | Admitting: Cardiology

## 2022-03-16 DIAGNOSIS — R9431 Abnormal electrocardiogram [ECG] [EKG]: Secondary | ICD-10-CM

## 2022-03-23 ENCOUNTER — Telehealth: Payer: Self-pay | Admitting: Cardiology

## 2022-03-23 NOTE — Telephone Encounter (Signed)
Pt c/o medication issue:  1. Name of Medication:   rosuvastatin (CRESTOR) 5 MG tablet    2. How are you currently taking this medication (dosage and times per day)?  Take 5 mg by mouth 3 (three) times a week. At bedtime.  3. Are you having a reaction (difficulty breathing--STAT)?  No 4. What is your medication issue?  Pt would like a callback regarding stopping medication. Please advise

## 2022-03-23 NOTE — Telephone Encounter (Signed)
Spoke with patient of Dr. Percival Spanish. She said she was informed at last visit that if CAC was 0 she may could stop statin. Advised did not see this in note and will send to MD. She is aware call back may be next week. OK to send her a MyChart message  Per last note:  ASSESSMENT AND PLAN:   DYSLIPIDEMIA: The patient does not tolerate high-dose statins.  I think goals of therapy can best be established by starting with a coronary calcium score.  She does not have otherwise a lot of risk factors.  She will increase from 3 times a week to 4 times a week her statin and then we can proceed from there based on the results of the score.  We did talk about a plant-based fish heavy diet.

## 2022-03-23 NOTE — Telephone Encounter (Signed)
Patient called w/advice from MD. Med list updated

## 2022-03-23 NOTE — Telephone Encounter (Signed)
Minus Breeding, MD  You12 minutes ago (1:11 PM)    OK to stop statin

## 2022-05-30 DIAGNOSIS — M25562 Pain in left knee: Secondary | ICD-10-CM | POA: Diagnosis not present

## 2022-05-30 DIAGNOSIS — Z96652 Presence of left artificial knee joint: Secondary | ICD-10-CM | POA: Diagnosis not present

## 2022-05-30 DIAGNOSIS — M1711 Unilateral primary osteoarthritis, right knee: Secondary | ICD-10-CM | POA: Diagnosis not present

## 2022-06-13 ENCOUNTER — Other Ambulatory Visit: Payer: Self-pay | Admitting: Family Medicine

## 2022-06-13 DIAGNOSIS — Z1231 Encounter for screening mammogram for malignant neoplasm of breast: Secondary | ICD-10-CM

## 2022-08-22 ENCOUNTER — Ambulatory Visit
Admission: RE | Admit: 2022-08-22 | Discharge: 2022-08-22 | Disposition: A | Discharge status: Home / Self Care | Payer: Medicare PPO | Source: Ambulatory Visit | Attending: Family Medicine | Admitting: Family Medicine

## 2022-08-22 DIAGNOSIS — Z1231 Encounter for screening mammogram for malignant neoplasm of breast: Secondary | ICD-10-CM | POA: Diagnosis not present

## 2022-08-29 DIAGNOSIS — Z96652 Presence of left artificial knee joint: Secondary | ICD-10-CM | POA: Diagnosis not present

## 2022-08-29 DIAGNOSIS — M1711 Unilateral primary osteoarthritis, right knee: Secondary | ICD-10-CM | POA: Diagnosis not present

## 2022-09-26 DIAGNOSIS — Z23 Encounter for immunization: Secondary | ICD-10-CM | POA: Diagnosis not present

## 2022-10-08 DIAGNOSIS — M25561 Pain in right knee: Secondary | ICD-10-CM | POA: Diagnosis not present

## 2022-10-30 DIAGNOSIS — Z23 Encounter for immunization: Secondary | ICD-10-CM | POA: Diagnosis not present

## 2022-11-19 DIAGNOSIS — D2221 Melanocytic nevi of right ear and external auricular canal: Secondary | ICD-10-CM | POA: Diagnosis not present

## 2022-11-19 DIAGNOSIS — L821 Other seborrheic keratosis: Secondary | ICD-10-CM | POA: Diagnosis not present

## 2022-11-19 DIAGNOSIS — L718 Other rosacea: Secondary | ICD-10-CM | POA: Diagnosis not present

## 2022-11-19 DIAGNOSIS — D225 Melanocytic nevi of trunk: Secondary | ICD-10-CM | POA: Diagnosis not present

## 2022-11-19 DIAGNOSIS — D224 Melanocytic nevi of scalp and neck: Secondary | ICD-10-CM | POA: Diagnosis not present

## 2022-11-19 DIAGNOSIS — D1801 Hemangioma of skin and subcutaneous tissue: Secondary | ICD-10-CM | POA: Diagnosis not present

## 2022-11-19 NOTE — Patient Instructions (Signed)
SURGICAL WAITING ROOM VISITATION  Patients having surgery or a procedure may have no more than 2 support people in the waiting area - these visitors may rotate.    Children under the age of 38 must have an adult with them who is not the patient.  Due to an increase in RSV and influenza rates and associated hospitalizations, children ages 47 and under may not visit patients in [facility] hospitals.  If the patient needs to stay at the hospital during part of their recovery, the visitor guidelines for inpatient rooms apply. Pre-op nurse will coordinate an appropriate time for 1 support person to accompany patient in pre-op.  This support person may not rotate.    Please refer to the [facility] website for the visitor guidelines for Inpatients (after your surgery is over and you are in a regular room).       Your procedure is scheduled on:  11/27/2022    Report to [facility] Main Entrance    Report to admitting at   [phone]   Call this number if you have problems the morning of surgery [phone]   Do not eat food :After Midnight.   After Midnight you may have the following liquids until _0415_____ AM DAY OF SURGERY  Water Non-Citrus Juices (without pulp, NO RED-Apple, White grape, White cranberry) Black Coffee (NO MILK/CREAM OR CREAMERS, sugar ok)  Clear Tea (NO MILK/CREAM OR CREAMERS, sugar ok) regular and decaf                             Plain Jell-O (NO RED)                                           Fruit ices (not with fruit pulp, NO RED)                                     Popsicles (NO RED)                                                               Sports drinks like Gatorade (NO RED)                    The day of surgery:  Drink ONE (1) Pre-Surgery Clear Ensure or G2 at  0415 AM  ( have completed by ) the morning of surgery. Drink in one sitting. Do not sip.  This drink was given to you during your hospital  pre-op appointment visit. Nothing else to  drink after completing the  Pre-Surgery Clear Ensure or G2.          If you have questions, please contact your surgeon's office.       Oral Hygiene is also important to reduce your risk of infection.                                    Remember - BRUSH YOUR TEETH THE MORNING OF SURGERY WITH YOUR  REGULAR TOOTHPASTE  DENTURES WILL BE REMOVED PRIOR TO SURGERY PLEASE DO NOT APPLY "Poly grip" OR ADHESIVES!!!   Do NOT smoke after Midnight   Stop all vitamins and herbal supplements 7 days before surgery.   Take these medicines the morning of surgery with A SIP OF WATER:  omeprazole , synthroid   DO NOT TAKE ANY ORAL DIABETIC MEDICATIONS DAY OF YOUR SURGERY  Bring CPAP mask and tubing day of surgery.                              You may not have any metal on your body including hair pins, jewelry, and body piercing             Do not wear make-up, lotions, powders, perfumes/cologne, or deodorant  Do not wear nail polish including gel and S&S, artificial/acrylic nails, or any other type of covering on natural nails including finger and toenails. If you have artificial nails, gel coating, etc. that needs to be removed by a nail salon please have this removed prior to surgery or surgery may need to be canceled/ delayed if the surgeon/ anesthesia feels like they are unable to be safely monitored.   Do not shave  48 hours prior to surgery.               Men may shave face and neck.   Do not bring valuables to the hospital. [facility] IS NOT             RESPONSIBLE   FOR VALUABLES.   Contacts, glasses, dentures or bridgework may not be worn into surgery.   Bring small overnight bag day of surgery.   DO NOT BRING YOUR HOME MEDICATIONS TO THE HOSPITAL. PHARMACY WILL DISPENSE MEDICATIONS LISTED ON YOUR MEDICATION LIST TO YOU DURING YOUR ADMISSION IN THE HOSPITAL!    Patients discharged on the day of surgery will not be allowed to drive home.  Someone NEEDS to stay with you for the first 24  hours after anesthesia.   Special Instructions: Bring a copy of your healthcare power of attorney and living will documents the day of surgery if you haven't scanned them before.              Please read over the following fact sheets you were given: IF YOU HAVE QUESTIONS ABOUT YOUR PRE-OP INSTRUCTIONS PLEASE CALL [phone]   If you received a COVID test during your pre-op visit  it is requested that you wear a mask when out in public, stay away from anyone that may not be feeling well and notify your surgeon if you develop symptoms. If you test positive for Covid or have been in contact with anyone that has tested positive in the last 10 days please notify you surgeon.      Pre-operative 5 CHG Bath Instructions   You can play a key role in reducing the risk of infection after surgery. Your skin needs to be as free of germs as possible. You can reduce the number of germs on your skin by washing with CHG (chlorhexidine gluconate) soap before surgery. CHG is an antiseptic soap that kills germs and continues to kill germs even after washing.   DO NOT use if you have an allergy to chlorhexidine/CHG or antibacterial soaps. If your skin becomes reddened or irritated, stop using the CHG and notify one of our RNs at [phone].   Please shower with the CHG soap  starting 4 days before surgery using the following schedule:     Please keep in mind the following:  DO NOT shave, including legs and underarms, starting the day of your first shower.   You may shave your face at any point before/day of surgery.  Place clean sheets on your bed the day you start using CHG soap. Use a clean washcloth (not used since being washed) for each shower. DO NOT sleep with pets once you start using the CHG.   CHG Shower Instructions:  If you choose to wash your hair and private area, wash first with your normal shampoo/soap.  After you use shampoo/soap, rinse your hair and body thoroughly to remove  shampoo/soap residue.  Turn the water OFF and apply about 3 tablespoons (45 ml) of CHG soap to a CLEAN washcloth.  Apply CHG soap ONLY FROM YOUR NECK DOWN TO YOUR TOES (washing for 3-5 minutes)  DO NOT use CHG soap on face, private areas, open wounds, or sores.  Pay special attention to the area where your surgery is being performed.  If you are having back surgery, having someone wash your back for you may be helpful. Wait 2 minutes after CHG soap is applied, then you may rinse off the CHG soap.  Pat dry with a clean towel  Put on clean clothes/pajamas   If you choose to wear lotion, please use ONLY the CHG-compatible lotions on the back of this paper.     Additional instructions for the day of surgery: DO NOT APPLY any lotions, deodorants, cologne, or perfumes.   Put on clean/comfortable clothes.  Brush your teeth.  Ask your nurse before applying any prescription medications to the skin.      CHG Compatible Lotions   Aveeno Moisturizing lotion  Cetaphil Moisturizing Cream  Cetaphil Moisturizing Lotion  Clairol Herbal Essence Moisturizing Lotion, Dry Skin  Clairol Herbal Essence Moisturizing Lotion, Extra Dry Skin  Clairol Herbal Essence Moisturizing Lotion, Normal Skin  Curel Age Defying Therapeutic Moisturizing Lotion with Alpha Hydroxy  Curel Extreme Care Body Lotion  Curel Soothing Hands Moisturizing Hand Lotion  Curel Therapeutic Moisturizing Cream, Fragrance-Free  Curel Therapeutic Moisturizing Lotion, Fragrance-Free  Curel Therapeutic Moisturizing Lotion, Original Formula  Eucerin Daily Replenishing Lotion  Eucerin Dry Skin Therapy Plus Alpha Hydroxy Crme  Eucerin Dry Skin Therapy Plus Alpha Hydroxy Lotion  Eucerin Original Crme  Eucerin Original Lotion  Eucerin Plus Crme Eucerin Plus Lotion  Eucerin TriLipid Replenishing Lotion  Keri Anti-Bacterial Hand Lotion  Keri Deep Conditioning Original Lotion Dry Skin Formula Softly Scented  Keri Deep Conditioning  Original Lotion, Fragrance Free Sensitive Skin Formula  Keri Lotion Fast Absorbing Fragrance Free Sensitive Skin Formula  Keri Lotion Fast Absorbing Softly Scented Dry Skin Formula  Keri Original Lotion  Keri Skin Renewal Lotion Keri Silky Smooth Lotion  Keri Silky Smooth Sensitive Skin Lotion  Nivea Body Creamy Conditioning Oil  Nivea Body Extra Enriched Teacher, adult education Moisturizing Lotion Nivea Crme  Nivea Skin Firming Lotion  NutraDerm 30 Skin Lotion  NutraDerm Skin Lotion  NutraDerm Therapeutic Skin Cream  NutraDerm Therapeutic Skin Lotion  ProShield Protective Hand Cream  Provon moisturizing lotion

## 2022-11-19 NOTE — Progress Notes (Signed)
Anesthesia Review:  PCP: Jonette Mate clearance 09/18/22 LOV 02/07/22 on chart  Cardiologist : Hochrein- LOV 02/27/22  Chest x-ray : EKG : 02/27/22  CT chest- 03/17/22  Echo : Stress test: Cardiac Cath :  Activity level:  Sleep Study/ CPAP : Fasting Blood Sugar :      / Checks Blood Sugar -- times a day:   Blood Thinner/ Instructions /Last Dose: ASA / Instructions/ Last Dose :

## 2022-11-20 ENCOUNTER — Other Ambulatory Visit: Payer: Self-pay

## 2022-11-20 ENCOUNTER — Encounter (HOSPITAL_COMMUNITY): Payer: Self-pay

## 2022-11-20 ENCOUNTER — Encounter (HOSPITAL_COMMUNITY)
Admission: RE | Admit: 2022-11-20 | Discharge: 2022-11-20 | Disposition: A | Discharge status: Home / Self Care | Payer: Medicare PPO | Source: Ambulatory Visit | Attending: Orthopedic Surgery | Admitting: Orthopedic Surgery

## 2022-11-20 VITALS — BP 140/80 | HR 63 | Temp 98.4°F | Ht 62.0 in | Wt 164.0 lb

## 2022-11-20 DIAGNOSIS — Z01812 Encounter for preprocedural laboratory examination: Secondary | ICD-10-CM | POA: Insufficient documentation

## 2022-11-20 DIAGNOSIS — Z01818 Encounter for other preprocedural examination: Secondary | ICD-10-CM

## 2022-11-20 HISTORY — DX: Other specified postprocedural states: R11.2

## 2022-11-20 HISTORY — DX: Unspecified osteoarthritis, unspecified site: M19.90

## 2022-11-20 HISTORY — DX: Other specified postprocedural states: Z98.890

## 2022-11-20 LAB — CBC
HCT: 41.6 % (ref 36.0–46.0)
Hemoglobin: 13.1 g/dL (ref 12.0–15.0)
MCH: 28.1 pg (ref 26.0–34.0)
MCHC: 31.5 g/dL (ref 30.0–36.0)
MCV: 89.3 fL (ref 80.0–100.0)
Platelets: 283 10*3/uL (ref 150–400)
RBC: 4.66 MIL/uL (ref 3.87–5.11)
RDW: 14.3 % (ref 11.5–15.5)
WBC: 6.7 10*3/uL (ref 4.0–10.5)
nRBC: 0 % (ref 0.0–0.2)

## 2022-11-20 LAB — SURGICAL PCR SCREEN
MRSA, PCR: NEGATIVE
Staphylococcus aureus: NEGATIVE

## 2022-11-26 DIAGNOSIS — H53143 Visual discomfort, bilateral: Secondary | ICD-10-CM | POA: Diagnosis not present

## 2022-11-26 DIAGNOSIS — H524 Presbyopia: Secondary | ICD-10-CM | POA: Diagnosis not present

## 2022-11-26 DIAGNOSIS — H5212 Myopia, left eye: Secondary | ICD-10-CM | POA: Diagnosis not present

## 2022-11-26 DIAGNOSIS — H2513 Age-related nuclear cataract, bilateral: Secondary | ICD-10-CM | POA: Diagnosis not present

## 2022-11-26 DIAGNOSIS — H5201 Hypermetropia, right eye: Secondary | ICD-10-CM | POA: Diagnosis not present

## 2022-11-26 DIAGNOSIS — H52223 Regular astigmatism, bilateral: Secondary | ICD-10-CM | POA: Diagnosis not present

## 2022-11-27 ENCOUNTER — Encounter (HOSPITAL_COMMUNITY): Payer: Self-pay | Admitting: Orthopedic Surgery

## 2022-11-27 ENCOUNTER — Ambulatory Visit (HOSPITAL_COMMUNITY): Payer: Medicare PPO | Admitting: Anesthesiology

## 2022-11-27 ENCOUNTER — Encounter (HOSPITAL_COMMUNITY)
Admission: RE | Disposition: A | Discharge status: Home / Self Care | Payer: Self-pay | Source: Home / Self Care | Attending: Orthopedic Surgery

## 2022-11-27 ENCOUNTER — Other Ambulatory Visit: Payer: Self-pay

## 2022-11-27 ENCOUNTER — Observation Stay (HOSPITAL_COMMUNITY)
Admission: RE | Admit: 2022-11-27 | Discharge: 2022-11-28 | Disposition: A | Discharge status: Home / Self Care | Payer: Medicare PPO | Attending: Orthopedic Surgery | Admitting: Orthopedic Surgery

## 2022-11-27 ENCOUNTER — Ambulatory Visit (HOSPITAL_COMMUNITY): Payer: Medicare PPO | Admitting: Medical

## 2022-11-27 DIAGNOSIS — M1711 Unilateral primary osteoarthritis, right knee: Principal | ICD-10-CM | POA: Insufficient documentation

## 2022-11-27 DIAGNOSIS — Z87891 Personal history of nicotine dependence: Secondary | ICD-10-CM | POA: Diagnosis not present

## 2022-11-27 DIAGNOSIS — Z96651 Presence of right artificial knee joint: Principal | ICD-10-CM

## 2022-11-27 DIAGNOSIS — Z01818 Encounter for other preprocedural examination: Secondary | ICD-10-CM

## 2022-11-27 DIAGNOSIS — G8918 Other acute postprocedural pain: Secondary | ICD-10-CM | POA: Diagnosis not present

## 2022-11-27 HISTORY — PX: TOTAL KNEE ARTHROPLASTY: SHX125

## 2022-11-27 SURGERY — ARTHROPLASTY, KNEE, TOTAL
Anesthesia: Spinal | Site: Knee | Laterality: Right

## 2022-11-27 MED ORDER — OXYCODONE HCL 5 MG PO TABS
10.0000 mg | ORAL_TABLET | ORAL | Status: DC | PRN
  Filled 2022-11-27: qty 2

## 2022-11-27 MED ORDER — FENTANYL CITRATE (PF) 100 MCG/2ML IJ SOLN
INTRAMUSCULAR | Status: AC
  Filled 2022-11-27: qty 2

## 2022-11-27 MED ORDER — DEXAMETHASONE SODIUM PHOSPHATE 10 MG/ML IJ SOLN
10.0000 mg | Freq: Once | INTRAMUSCULAR | Status: AC
  Administered 2022-11-28: 10 mg via INTRAVENOUS
  Filled 2022-11-27: qty 1

## 2022-11-27 MED ORDER — PROPOFOL 10 MG/ML IV BOLUS
INTRAVENOUS | Status: AC
  Filled 2022-11-27: qty 20

## 2022-11-27 MED ORDER — ONDANSETRON HCL 4 MG PO TABS: 4.0000 mg | ORAL_TABLET | Freq: Four times a day (QID) | ORAL | Status: DC | PRN

## 2022-11-27 MED ORDER — LACTATED RINGERS IV SOLN: INTRAVENOUS | Status: DC

## 2022-11-27 MED ORDER — HYDROMORPHONE HCL 1 MG/ML IJ SOLN: 0.5000 mg | INTRAMUSCULAR | Status: DC | PRN

## 2022-11-27 MED ORDER — ORAL CARE MOUTH RINSE: 15.0000 mL | Freq: Once | OROMUCOSAL | Status: AC

## 2022-11-27 MED ORDER — CHLORHEXIDINE GLUCONATE 0.12 % MT SOLN
15.0000 mL | Freq: Once | OROMUCOSAL | Status: AC
  Administered 2022-11-27: 15 mL via OROMUCOSAL

## 2022-11-27 MED ORDER — KETAMINE HCL 50 MG/5ML IJ SOSY
PREFILLED_SYRINGE | INTRAMUSCULAR | Status: AC
  Filled 2022-11-27: qty 5

## 2022-11-27 MED ORDER — CELECOXIB 200 MG PO CAPS
200.0000 mg | ORAL_CAPSULE | Freq: Every day | ORAL | Status: DC
  Administered 2022-11-27 – 2022-11-28 (×2): 200 mg via ORAL
  Filled 2022-11-27 (×2): qty 1

## 2022-11-27 MED ORDER — HYDROMORPHONE HCL 1 MG/ML IJ SOLN: 0.2500 mg | INTRAMUSCULAR | Status: DC | PRN

## 2022-11-27 MED ORDER — ALUM & MAG HYDROXIDE-SIMETH 200-200-20 MG/5ML PO SUSP: 30.0000 mL | ORAL | Status: DC | PRN

## 2022-11-27 MED ORDER — KETOROLAC TROMETHAMINE 30 MG/ML IJ SOLN
INTRAMUSCULAR | Status: AC
  Filled 2022-11-27: qty 1

## 2022-11-27 MED ORDER — ROPIVACAINE HCL 5 MG/ML IJ SOLN
INTRAMUSCULAR | Status: DC | PRN
Start: 2022-11-27 — End: 2022-11-27
  Administered 2022-11-27: 20 mL via PERINEURAL

## 2022-11-27 MED ORDER — METOCLOPRAMIDE HCL 5 MG/ML IJ SOLN: 5.0000 mg | Freq: Three times a day (TID) | INTRAMUSCULAR | Status: DC | PRN

## 2022-11-27 MED ORDER — METHOCARBAMOL 1000 MG/10ML IJ SOLN: 500.0000 mg | Freq: Four times a day (QID) | INTRAVENOUS | Status: DC | PRN

## 2022-11-27 MED ORDER — MELATONIN 5 MG PO TABS
5.0000 mg | ORAL_TABLET | Freq: Every day | ORAL | Status: DC
  Administered 2022-11-27: 10 mg via ORAL
  Filled 2022-11-27: qty 2

## 2022-11-27 MED ORDER — METOCLOPRAMIDE HCL 5 MG PO TABS: 5.0000 mg | ORAL_TABLET | Freq: Three times a day (TID) | ORAL | Status: DC | PRN

## 2022-11-27 MED ORDER — SODIUM CHLORIDE 0.9 % IR SOLN
Status: DC | PRN
  Administered 2022-11-27: 1000 mL

## 2022-11-27 MED ORDER — AMISULPRIDE (ANTIEMETIC) 5 MG/2ML IV SOLN
INTRAVENOUS | Status: AC
  Filled 2022-11-27: qty 4

## 2022-11-27 MED ORDER — CEFAZOLIN SODIUM-DEXTROSE 2-4 GM/100ML-% IV SOLN
2.0000 g | INTRAVENOUS | Status: AC
  Administered 2022-11-27: 2 g via INTRAVENOUS
  Filled 2022-11-27: qty 100

## 2022-11-27 MED ORDER — CEFAZOLIN SODIUM-DEXTROSE 2-4 GM/100ML-% IV SOLN
2.0000 g | Freq: Four times a day (QID) | INTRAVENOUS | Status: AC
  Administered 2022-11-27 (×2): 2 g via INTRAVENOUS
  Filled 2022-11-27 (×2): qty 100

## 2022-11-27 MED ORDER — OXYCODONE HCL 5 MG PO TABS
5.0000 mg | ORAL_TABLET | ORAL | Status: DC | PRN
  Administered 2022-11-27 – 2022-11-28 (×2): 5 mg via ORAL
  Filled 2022-11-27 (×2): qty 1
  Filled 2022-11-27: qty 2
  Filled 2022-11-27: qty 1

## 2022-11-27 MED ORDER — BISACODYL 10 MG RE SUPP: 10.0000 mg | Freq: Every day | RECTAL | Status: DC | PRN

## 2022-11-27 MED ORDER — PHENYLEPHRINE HCL (PRESSORS) 10 MG/ML IV SOLN
INTRAVENOUS | Status: DC | PRN
Start: 2022-11-27 — End: 2022-11-27
  Administered 2022-11-27 (×2): 80 ug via INTRAVENOUS

## 2022-11-27 MED ORDER — PHENYLEPHRINE HCL (PRESSORS) 10 MG/ML IV SOLN
INTRAVENOUS | Status: AC
  Filled 2022-11-27: qty 1

## 2022-11-27 MED ORDER — STERILE WATER FOR IRRIGATION IR SOLN
Status: DC | PRN
  Administered 2022-11-27: 2000 mL

## 2022-11-27 MED ORDER — SODIUM CHLORIDE 0.9% FLUSH: 3.0000 mL | Freq: Two times a day (BID) | INTRAVENOUS | Status: DC

## 2022-11-27 MED ORDER — SODIUM CHLORIDE (PF) 0.9 % IJ SOLN
INTRAMUSCULAR | Status: DC | PRN
  Administered 2022-11-27: 61 mL

## 2022-11-27 MED ORDER — DEXAMETHASONE SODIUM PHOSPHATE 10 MG/ML IJ SOLN
8.0000 mg | Freq: Once | INTRAMUSCULAR | Status: AC
  Administered 2022-11-27: 8 mg via INTRAVENOUS

## 2022-11-27 MED ORDER — ACETAMINOPHEN 500 MG PO TABS
1000.0000 mg | ORAL_TABLET | Freq: Four times a day (QID) | ORAL | Status: DC
  Administered 2022-11-27 – 2022-11-28 (×4): 1000 mg via ORAL
  Filled 2022-11-27 (×4): qty 2

## 2022-11-27 MED ORDER — PROPOFOL 10 MG/ML IV BOLUS
INTRAVENOUS | Status: DC | PRN
  Administered 2022-11-27: 20 mg via INTRAVENOUS

## 2022-11-27 MED ORDER — HYDROMORPHONE HCL 1 MG/ML IJ SOLN
INTRAMUSCULAR | Status: AC
  Administered 2022-11-27: 0.25 mg via INTRAVENOUS
  Filled 2022-11-27: qty 1

## 2022-11-27 MED ORDER — OXYCODONE HCL 5 MG PO TABS: 5.0000 mg | ORAL_TABLET | Freq: Once | ORAL | Status: DC | PRN

## 2022-11-27 MED ORDER — SENNA 8.6 MG PO TABS
2.0000 | ORAL_TABLET | Freq: Every day | ORAL | Status: DC
  Administered 2022-11-27: 17.2 mg via ORAL
  Filled 2022-11-27: qty 2

## 2022-11-27 MED ORDER — ONDANSETRON HCL 4 MG/2ML IJ SOLN: 4.0000 mg | Freq: Four times a day (QID) | INTRAMUSCULAR | Status: DC | PRN

## 2022-11-27 MED ORDER — AMISULPRIDE (ANTIEMETIC) 5 MG/2ML IV SOLN
10.0000 mg | Freq: Once | INTRAVENOUS | Status: AC | PRN
  Administered 2022-11-27: 10 mg via INTRAVENOUS

## 2022-11-27 MED ORDER — TRANEXAMIC ACID-NACL 1000-0.7 MG/100ML-% IV SOLN
1000.0000 mg | Freq: Once | INTRAVENOUS | Status: AC
  Administered 2022-11-27: 1000 mg via INTRAVENOUS
  Filled 2022-11-27: qty 100

## 2022-11-27 MED ORDER — MIDAZOLAM HCL 5 MG/5ML IJ SOLN
INTRAMUSCULAR | Status: DC | PRN
  Administered 2022-11-27: 1 mg via INTRAVENOUS

## 2022-11-27 MED ORDER — SODIUM CHLORIDE 0.9 % IV SOLN: 12.5000 mg | INTRAVENOUS | Status: DC | PRN

## 2022-11-27 MED ORDER — PHENYLEPHRINE HCL-NACL 20-0.9 MG/250ML-% IV SOLN
INTRAVENOUS | Status: DC | PRN
Start: 2022-11-27 — End: 2022-11-27
  Administered 2022-11-27: 20 ug/min via INTRAVENOUS

## 2022-11-27 MED ORDER — PANTOPRAZOLE SODIUM 40 MG PO TBEC
40.0000 mg | DELAYED_RELEASE_TABLET | Freq: Every day | ORAL | Status: DC
  Administered 2022-11-27 – 2022-11-28 (×2): 40 mg via ORAL
  Filled 2022-11-27 (×2): qty 1

## 2022-11-27 MED ORDER — MENTHOL 3 MG MT LOZG: 1.0000 | LOZENGE | OROMUCOSAL | Status: DC | PRN

## 2022-11-27 MED ORDER — OXYCODONE HCL 5 MG/5ML PO SOLN: 5.0000 mg | Freq: Once | ORAL | Status: DC | PRN

## 2022-11-27 MED ORDER — DIPHENHYDRAMINE HCL 12.5 MG/5ML PO ELIX: 12.5000 mg | ORAL_SOLUTION | ORAL | Status: DC | PRN

## 2022-11-27 MED ORDER — TRANEXAMIC ACID-NACL 1000-0.7 MG/100ML-% IV SOLN
1000.0000 mg | INTRAVENOUS | Status: AC
  Administered 2022-11-27: 1000 mg via INTRAVENOUS
  Filled 2022-11-27: qty 100

## 2022-11-27 MED ORDER — PHENOL 1.4 % MT LIQD: 1.0000 | OROMUCOSAL | Status: DC | PRN

## 2022-11-27 MED ORDER — PROPOFOL 500 MG/50ML IV EMUL
INTRAVENOUS | Status: DC | PRN
Start: 2022-11-27 — End: 2022-11-27
  Administered 2022-11-27: 100 ug/kg/min via INTRAVENOUS

## 2022-11-27 MED ORDER — 0.9 % SODIUM CHLORIDE (POUR BTL) OPTIME
TOPICAL | Status: DC | PRN
  Administered 2022-11-27: 1000 mL

## 2022-11-27 MED ORDER — POLYETHYLENE GLYCOL 3350 17 G PO PACK
17.0000 g | PACK | Freq: Two times a day (BID) | ORAL | Status: DC
  Administered 2022-11-27 – 2022-11-28 (×3): 17 g via ORAL
  Filled 2022-11-27 (×3): qty 1

## 2022-11-27 MED ORDER — METHOCARBAMOL 500 MG PO TABS: 500.0000 mg | ORAL_TABLET | Freq: Four times a day (QID) | ORAL | Status: DC | PRN

## 2022-11-27 MED ORDER — LACTATED RINGERS IV SOLN
INTRAVENOUS | Status: DC | PRN
Start: 2022-11-27 — End: 2022-11-27

## 2022-11-27 MED ORDER — FENTANYL CITRATE (PF) 100 MCG/2ML IJ SOLN
INTRAMUSCULAR | Status: DC | PRN
  Administered 2022-11-27 (×4): 50 ug via INTRAVENOUS

## 2022-11-27 MED ORDER — ASPIRIN 81 MG PO CHEW
81.0000 mg | CHEWABLE_TABLET | Freq: Two times a day (BID) | ORAL | Status: DC
  Administered 2022-11-27 – 2022-11-28 (×2): 81 mg via ORAL
  Filled 2022-11-27 (×2): qty 1

## 2022-11-27 MED ORDER — BUPIVACAINE IN DEXTROSE 0.75-8.25 % IT SOLN
INTRATHECAL | Status: DC | PRN
Start: 2022-11-27 — End: 2022-11-27
  Administered 2022-11-27: 2 mL via INTRATHECAL

## 2022-11-27 MED ORDER — BUPIVACAINE-EPINEPHRINE 0.25% -1:200000 IJ SOLN
INTRAMUSCULAR | Status: AC
  Filled 2022-11-27: qty 1

## 2022-11-27 MED ORDER — LEVOTHYROXINE SODIUM 75 MCG PO TABS
75.0000 ug | ORAL_TABLET | Freq: Every day | ORAL | Status: DC
  Administered 2022-11-28: 75 ug via ORAL
  Filled 2022-11-27: qty 1

## 2022-11-27 MED ORDER — MIDAZOLAM HCL 2 MG/2ML IJ SOLN
INTRAMUSCULAR | Status: AC
  Filled 2022-11-27: qty 2

## 2022-11-27 MED ORDER — LIDOCAINE HCL (CARDIAC) PF 100 MG/5ML IV SOSY
PREFILLED_SYRINGE | INTRAVENOUS | Status: DC | PRN
  Administered 2022-11-27: 50 mg via INTRAVENOUS

## 2022-11-27 MED ORDER — POVIDONE-IODINE 10 % EX SWAB: 2.0000 | Freq: Once | CUTANEOUS | Status: DC

## 2022-11-27 MED ORDER — LEVOTHYROXINE SODIUM 75 MCG PO TABS: 75.0000 ug | ORAL_TABLET | Freq: Every day | ORAL | Status: DC

## 2022-11-27 MED ORDER — SODIUM CHLORIDE (PF) 0.9 % IJ SOLN
INTRAMUSCULAR | Status: AC
  Filled 2022-11-27: qty 50

## 2022-11-27 MED ORDER — KETAMINE HCL 10 MG/ML IJ SOLN
INTRAMUSCULAR | Status: DC | PRN
Start: 2022-11-27 — End: 2022-11-27
  Administered 2022-11-27: 20 mg via INTRAVENOUS

## 2022-11-27 SURGICAL SUPPLY — 55 items
ADH SKN CLS APL DERMABOND .7 (GAUZE/BANDAGES/DRESSINGS) ×1
ATTUNE MED ANAT PAT 35 KNEE (Knees) IMPLANT
BAG COUNTER SPONGE SURGICOUNT (BAG) IMPLANT
BAG SPEC THK2 15X12 ZIP CLS (MISCELLANEOUS)
BAG SPNG CNTER NS LX DISP (BAG)
BAG ZIPLOCK 12X15 (MISCELLANEOUS) IMPLANT
BASEPLATE TIB CMT FB PCKT SZ3 (Knees) IMPLANT
BLADE SAW SGTL 13.0X1.19X90.0M (BLADE) ×1 IMPLANT
BNDG CMPR 6 X 5 YARDS HK CLSR (GAUZE/BANDAGES/DRESSINGS) ×1
BNDG ELASTIC 6INX 5YD STR LF (GAUZE/BANDAGES/DRESSINGS) ×1 IMPLANT
BOWL SMART MIX CTS (DISPOSABLE) ×1 IMPLANT
BSPLAT TIB 3 CMNT FXBRNG STRL (Knees) ×1 IMPLANT
CEMENT HV SMART SET (Cement) IMPLANT
COMP FEM CMT ATTUNE NRW 4 RT (Joint) ×1 IMPLANT
COMPONENT FEM CMT ATTN NRW 4RT (Joint) IMPLANT
COVER SURGICAL LIGHT HANDLE (MISCELLANEOUS) ×1 IMPLANT
CUFF TOURN SGL QUICK 34 (TOURNIQUET CUFF) ×1
CUFF TRNQT CYL 34X4.125X (TOURNIQUET CUFF) ×1 IMPLANT
DERMABOND ADVANCED .7 DNX12 (GAUZE/BANDAGES/DRESSINGS) ×1 IMPLANT
DRAPE U-SHAPE 47X51 STRL (DRAPES) ×1 IMPLANT
DRESSING AQUACEL AG SP 3.5X10 (GAUZE/BANDAGES/DRESSINGS) ×1 IMPLANT
DRSG AQUACEL AG ADV 3.5X10 (GAUZE/BANDAGES/DRESSINGS) IMPLANT
DRSG AQUACEL AG SP 3.5X10 (GAUZE/BANDAGES/DRESSINGS) ×1
DURAPREP 26ML APPLICATOR (WOUND CARE) ×2 IMPLANT
ELECT REM PT RETURN 15FT ADLT (MISCELLANEOUS) ×1 IMPLANT
GLOVE BIO SURGEON STRL SZ 6 (GLOVE) ×1 IMPLANT
GLOVE BIOGEL PI IND STRL 6.5 (GLOVE) ×1 IMPLANT
GLOVE BIOGEL PI IND STRL 7.5 (GLOVE) ×1 IMPLANT
GLOVE ORTHO TXT STRL SZ7.5 (GLOVE) ×2 IMPLANT
GOWN STRL REUS W/ TWL LRG LVL3 (GOWN DISPOSABLE) ×2 IMPLANT
GOWN STRL REUS W/TWL LRG LVL3 (GOWN DISPOSABLE) ×2
HANDPIECE INTERPULSE COAX TIP (DISPOSABLE) ×1
HOLDER FOLEY CATH W/STRAP (MISCELLANEOUS) IMPLANT
INSERT MED ATTUNE 4X6 RT (Insert) IMPLANT
KIT TURNOVER KIT A (KITS) IMPLANT
MANIFOLD NEPTUNE II (INSTRUMENTS) ×1 IMPLANT
NDL SAFETY ECLIP 18X1.5 (MISCELLANEOUS) IMPLANT
NS IRRIG 1000ML POUR BTL (IV SOLUTION) ×1 IMPLANT
PACK TOTAL KNEE CUSTOM (KITS) ×1 IMPLANT
PIN FIX SIGMA LCS THRD HI (PIN) IMPLANT
PROTECTOR NERVE ULNAR (MISCELLANEOUS) ×1 IMPLANT
SET HNDPC FAN SPRY TIP SCT (DISPOSABLE) ×1 IMPLANT
SET PAD KNEE POSITIONER (MISCELLANEOUS) ×1 IMPLANT
SPIKE FLUID TRANSFER (MISCELLANEOUS) ×2 IMPLANT
SUT MNCRL AB 4-0 PS2 18 (SUTURE) ×1 IMPLANT
SUT STRATAFIX PDS+ 0 24IN (SUTURE) ×1 IMPLANT
SUT VIC AB 1 CT1 36 (SUTURE) ×1 IMPLANT
SUT VIC AB 2-0 CT1 27 (SUTURE) ×2
SUT VIC AB 2-0 CT1 TAPERPNT 27 (SUTURE) ×2 IMPLANT
SYR 3ML LL SCALE MARK (SYRINGE) ×1 IMPLANT
TOWEL GREEN STERILE FF (TOWEL DISPOSABLE) ×1 IMPLANT
TRAY FOLEY MTR SLVR 16FR STAT (SET/KITS/TRAYS/PACK) ×1 IMPLANT
TUBE SUCTION HIGH CAP CLEAR NV (SUCTIONS) ×1 IMPLANT
WATER STERILE IRR 1000ML POUR (IV SOLUTION) ×2 IMPLANT
WRAP KNEE MAXI GEL POST OP (GAUZE/BANDAGES/DRESSINGS) ×1 IMPLANT

## 2022-11-27 NOTE — H&P (Signed)
TOTAL KNEE ADMISSION H&P  Patient is being admitted for right total knee arthroplasty.  Therapy Plans: outpatient therapy at [facility] Disposition: Lives alone (son & daughter first week, gentlemen friend second week) Planned DVT Prophylaxis: aspirin 81mg  BID DME needed: walker PCP: Dr. Chanetta Marshall - clearance received TXA: IV Allergies: lipitor - muscle aches Anesthesia Concerns: none BMI: 31.4 Last HgbA1c:   Other: - staying overnight - has borrowed CTU - Passes out/ faints easily - oxycodone, robaxin, tylenol, celebrex - No hx of VTE or cancer  Subjective:  Chief Complaint:right knee pain.  HPI: Christie Beasley, 72 y.o. female, has a history of pain and functional disability in the right knee due to arthritis and has failed non-surgical conservative treatments for greater than 12 weeks to includeNSAID's and/or analgesics, corticosteriod injections, and activity modification.  Onset of symptoms was gradual, starting 2 years ago with gradually worsening course since that time. The patient noted no past surgery on the right knee(s).  Patient currently rates pain in the right knee(s) at 8 out of 10 with activity. Patient has worsening of pain with activity and weight bearing and pain with passive range of motion.  Patient has evidence of joint space narrowing by imaging studies.  There is no active infection.  Patient Active Problem List   Diagnosis Date Noted   Educated about COVID-19 virus infection 07/07/2019   Palpitations 07/07/2019   Pain in right knee 07/11/2017   History of prosthetic unicompartmental arthroplasty of left knee 03/10/2015   Post-menopausal bleeding 11/12/2012   Vaginal atrophy 11/12/2012   Past Medical History:  Diagnosis Date   Acid reflux    Arthritis    Elevated lipids    Fibroid    History of fainting    pt reports she is a "fainter".     PONV (postoperative nausea and vomiting)    Rosacea    Thrombophlebitis 11/2000   Thyroid nodule  09/2002    Past Surgical History:  Procedure Laterality Date   BREAST EXCISIONAL BIOPSY Left    BREAST EXCISIONAL BIOPSY Right    CESAREAN SECTION     x 2   DILATION AND CURETTAGE OF UTERUS  1994   FOOT SURGERY Bilateral    hammer toes   HYSTEROSCOPY  01/11/2012   D&C   KNEE SURGERY Left 2012   MEDIAL PARTIAL KNEE REPLACEMENT Left 03/10/2015   THYROID LOBECTOMY Right 02/2003   TONSILLECTOMY AND ADENOIDECTOMY      Current Facility-Administered Medications  Medication Dose Route Frequency Provider Last Rate Last Admin   ceFAZolin (ANCEF) IVPB 2g/100 mL premix  2 g Intravenous On Call to OR Cassandria Anger, PA-C       dexamethasone (DECADRON) injection 8 mg  8 mg Intravenous Once Cassandria Anger, PA-C       lactated ringers infusion   Intravenous Continuous Cassandria Anger, PA-C       lactated ringers infusion   Intravenous Continuous Gaynelle Adu, MD 10 mL/hr at 11/27/22 0551 New Bag at 11/27/22 0551   povidone-iodine 10 % swab 2 Application  2 Application Topical Once Cassandria Anger, PA-C       tranexamic acid (CYKLOKAPRON) IVPB 1,000 mg  1,000 mg Intravenous To OR Cassandria Anger, PA-C       Allergies  Allergen Reactions   Lipitor  [Atorvastatin Calcium] Other (See Comments)    Ache    Social History   Tobacco Use   Smoking status: Former   Smokeless tobacco: Never  Substance  Use Topics   Alcohol use: Yes    Alcohol/week: 1.0 - 2.0 standard drink of alcohol    Types: 1 - 2 Standard drinks or equivalent per week    Comment: occas    Family History  Problem Relation Age of Onset   Cancer Father        Esophageal   Heart disease Mother        "Angina"     Review of Systems  Constitutional:  Negative for chills and fever.  Respiratory:  Negative for cough and shortness of breath.   Cardiovascular:  Negative for chest pain.  Gastrointestinal:  Negative for nausea and vomiting.  Musculoskeletal:  Positive for arthralgias.      Objective:  Physical Exam Well nourished and well developed. General: Alert and oriented x3, cooperative and pleasant, no acute distress. Head: normocephalic, atraumatic, neck supple. Eyes: EOMI.  Musculoskeletal: Right knee exam: No palpable effusion, warmth erythema Slight flexion contracture Tenderness over the medial aspect of the joint as well as anteriorly Flexion over 110 degrees with tightness and crepitation anteriorly Stable medial and lateral collateral ligaments  Calves soft and nontender. Motor function intact in LE. Strength 5/5 LE bilaterally. Neuro: Distal pulses 2+. Sensation to light touch intact in LE.  Vital signs in last 24 hours: Temp:  [98.6 F (37 C)] 98.6 F (37 C) (10/08 0537) Pulse Rate:  [95] 95 (10/08 0537) BP: (154)/(82) 154/82 (10/08 0537) SpO2:  [96 %] 96 % (10/08 0537) Weight:  [74.4 kg] 74.4 kg (10/08 0537)  Labs:   Estimated body mass index is 30 kg/m as calculated from the following:   Height as of this encounter: 5\' 2"  (1.575 m).   Weight as of this encounter: 74.4 kg.   Imaging Review Plain radiographs demonstrate severe degenerative joint disease of the right knee(s). The overall alignment isneutral. The bone quality appears to be adequate for age and reported activity level.      Assessment/Plan:  End stage arthritis, right knee   The patient history, physical examination, clinical judgment of the provider and imaging studies are consistent with end stage degenerative joint disease of the right knee(s) and total knee arthroplasty is deemed medically necessary. The treatment options including medical management, injection therapy arthroscopy and arthroplasty were discussed at length. The risks and benefits of total knee arthroplasty were presented and reviewed. The risks due to aseptic loosening, infection, stiffness, patella tracking problems, thromboembolic complications and other imponderables were discussed. The  patient acknowledged the explanation, agreed to proceed with the plan and consent was signed. Patient is being admitted for inpatient treatment for surgery, pain control, PT, OT, prophylactic antibiotics, VTE prophylaxis, progressive ambulation and ADL's and discharge planning. The patient is planning to be discharged  home.    Rosalene Billings, PA-C Orthopedic Surgery [facility] [place] [phone]

## 2022-11-27 NOTE — Discharge Instructions (Signed)

## 2022-11-27 NOTE — Anesthesia Postprocedure Evaluation (Signed)
Anesthesia Post Note  Patient: Christie Beasley  Procedure(s) Performed: RIGHT TOTAL KNEE ARTHROPLASTY (Right: Knee)     Patient location during evaluation: PACU Anesthesia Type: Spinal Level of consciousness: awake and alert Pain management: pain level controlled Vital Signs Assessment: post-procedure vital signs reviewed and stable Respiratory status: spontaneous breathing, nonlabored ventilation and respiratory function stable Cardiovascular status: blood pressure returned to baseline and stable Postop Assessment: no apparent nausea or vomiting Anesthetic complications: no   No notable events documented.  Last Vitals:  Vitals:   11/27/22 1015 11/27/22 1030  BP: 129/68 134/70  Pulse: (!) 56 62  Resp: 11 14  Temp:  (!) 36.4 C  SpO2: 100% 96%    Last Pain:  Vitals:   11/27/22 1030  TempSrc:   PainSc: 0-No pain                 Lowella Curb

## 2022-11-27 NOTE — Transfer of Care (Signed)
Immediate Anesthesia Transfer of Care Note  Patient: Christie Beasley  Procedure(s) Performed: Procedure(s): RIGHT TOTAL KNEE ARTHROPLASTY (Right)  Patient Location: PACU  Anesthesia Type:Regional and Spinal, mac  Level of Consciousness: Patient easily awoken, comfortable, cooperative, following commands, responds to stimulation.   Airway & Oxygen Therapy: Patient spontaneously breathing, ventilating well, oxygen via simple oxygen mask.  Post-op Assessment: Report given to PACU RN, vital signs reviewed and stable.   Post vital signs: Reviewed and stable.  Complications: No apparent anesthesia complications  Last Vitals:  Vitals Value Taken Time  BP 74/49 11/27/22 0930  Temp    Pulse 39 11/27/22 0931  Resp 14 11/27/22 0931  SpO2 96 % 11/27/22 0931  Vitals shown include unfiled device data.  Last Pain:  Vitals:   11/27/22 0537  TempSrc: Oral  PainSc: 0-No pain      Patients Stated Pain Goal: 4 (11/27/22 0537)  Complications: No notable events documented.

## 2022-11-27 NOTE — Op Note (Signed)
NAME:  Christie Beasley                      MEDICAL RECORD NO.:  [id number]                             FACILITY:  [facility]      PHYSICIAN:  Madlyn Frankel. Charlann Boxer, M.D.  DATE OF BIRTH:  02/06/1951      DATE OF PROCEDURE:  11/27/2022                                     OPERATIVE REPORT         PREOPERATIVE DIAGNOSIS:  Right knee osteoarthritis.      POSTOPERATIVE DIAGNOSIS:  Right knee osteoarthritis.      FINDINGS:  The patient was noted to have complete loss of cartilage and   bone-on-bone arthritis with associated osteophytes in the patellofemoral and lateral compartments of   the knee with a significant synovitis and associated effusion.  The patient had failed months of conservative treatment including medications, injection therapy, activity modification.     PROCEDURE:  Right total knee replacement.      COMPONENTS USED:  DePuy Attune FB CR MS knee   system, a size 4N femur, 3 tibia, size 6 mm CR MS AOX insert, and 35 anatomic patellar   button.      SURGEON:  Madlyn Frankel. Charlann Boxer, M.D.      ASSISTANT:  Rosalene Billings, PA-C.      ANESTHESIA:  Regional and Spinal.      SPECIMENS:  None.      COMPLICATION:  None.      DRAINS:  None.  EBL: <350 cc      TOURNIQUET TIME:  23 min at 300 mmHg     The patient was stable to the recovery room.      INDICATION FOR PROCEDURE:  Christie Beasley is a 72 y.o. female patient of   mine.  The patient had been seen, evaluated, and treated for months conservatively in the   office with medication, activity modification, and injections.  The patient had   radiographic changes of bone-on-bone arthritis with endplate sclerosis and osteophytes noted.  Based on the radiographic changes and failed conservative measures, the patient   decided to proceed with definitive treatment, total knee replacement.  Risks of infection, DVT, component failure, need for revision surgery, neurovascular injury were reviewed in the office setting.  The  postop course was reviewed stressing the efforts to maximize post-operative satisfaction and function.  Consent was obtained for benefit of pain   relief.      PROCEDURE IN DETAIL:  The patient was brought to the operative theater.   Once adequate anesthesia, preoperative antibiotics, 2 gm of Ancef,1 gm of Tranexamic Acid, and 10 mg of Decadron administered, the patient was positioned supine with a right thigh tourniquet placed.  The  right lower extremity was prepped and draped in sterile fashion.  A time-   out was performed identifying the patient, planned procedure, and the appropriate extremity.      The right lower extremity was placed in the [facility] leg holder.  The leg was   exsanguinated, tourniquet elevated to 225 mmHg.  A midline incision was   made followed by median parapatellar arthrotomy.  Following initial   exposure, attention was  first directed to the patella.  Precut   measurement was noted to be 17 mm.  I resected down to 13 mm and used a   35 anatomic patellar button to restore patellar height as well as cover the cut surface.      The lug holes were drilled and a metal shim was placed to protect the   patella from retractors and saw blade during the procedure.      At this point, attention was now directed to the femur.  The femoral   canal was opened with a drill, irrigated to try to prevent fat emboli.  An   intramedullary rod was passed at 3 degrees valgus, 9 mm of bone was   resected off the distal femur.  Following this resection, the tibia was   subluxated anteriorly.  Using the extramedullary guide, 4 mm of bone was resected off   the proximal medial tibia.  We confirmed the gap would be   stable medially and laterally with a size 5 spacer block as well as confirmed that the tibial cut was perpendicular in the coronal plane, checking with an alignment rod.      Once this was done, I sized the femur to be a size 4 in the anterior-   posterior dimension, chose a  narrow component based on medial and   lateral dimension.  The size 4 rotation block was then pinned in   position anterior referenced using the C-clamp to set rotation.  The   anterior, posterior, and  chamfer cuts were made without difficulty nor   notching making certain that I was along the anterior cortex to help   with flexion gap stability.      The final box cut was made off the lateral aspect of distal femur.      At this point, the tibia was sized to be a size 3.  The size 3 tray was   then pinned in position through the medial third of the tubercle,   drilled, and keel punched.  Trial reduction was now carried with a 4 femur,  3 tibia, a size 6 mm CR MS insert, and the 35 anatomic patella botton.  The knee was brought to full extension with good flexion stability with the patella   tracking through the trochlea without application of pressure.  Given   all these findings the trial components removed.  Final components were   opened and cement was mixed.  The knee was irrigated with normal saline solution and pulse lavage.  The synovial lining was   then injected with 30 cc of 0.25% Marcaine with epinephrine, 1 cc of Toradol and 30 cc of NS for a total of 61 cc.     Final implants were then cemented onto cleaned and dried cut surfaces of bone with the knee brought to extension with a size 6 mm CR MS trial insert.      Once the cement had fully cured, excess cement was removed   throughout the knee.  I confirmed that I was satisfied with the range of   motion and stability, and the final size 6 mm CR MS AOX insert was chosen.  It was   placed into the knee.      The tourniquet had been let down at 23 minutes.  No significant   hemostasis was required.  The extensor mechanism was then reapproximated using #1 Vicryl and #1 Stratafix sutures with the knee   in flexion.  The   remaining wound was closed with 2-0 Vicryl and running 4-0 Monocryl.   The knee was cleaned, dried, dressed  sterilely using Dermabond and   Aquacel dressing.  The patient was then   brought to recovery room in stable condition, tolerating the procedure   well.   Please note that Physician Assistant, Rosalene Billings, PA-C was present for the entirety of the case, and was utilized for pre-operative positioning, peri-operative retractor management, general facilitation of the procedure and for primary wound closure at the end of the case.              Madlyn Frankel Charlann Boxer, M.D.    11/27/2022 7:21 AM

## 2022-11-27 NOTE — Anesthesia Preprocedure Evaluation (Signed)
Anesthesia Evaluation  Patient identified by MRN, date of birth, ID band Patient awake    Reviewed: Allergy & Precautions, H&P , NPO status , Patient's Chart, lab work & pertinent test results  History of Anesthesia Complications (+) PONV and history of anesthetic complications  Airway Mallampati: II  TM Distance: >3 FB Neck ROM: Full    Dental no notable dental hx.    Pulmonary neg pulmonary ROS, former smoker   Pulmonary exam normal breath sounds clear to auscultation       Cardiovascular negative cardio ROS Normal cardiovascular exam Rhythm:Regular Rate:Normal     Neuro/Psych negative neurological ROS  negative psych ROS   GI/Hepatic Neg liver ROS,GERD  ,,  Endo/Other  negative endocrine ROS    Renal/GU negative Renal ROS  negative genitourinary   Musculoskeletal  (+) Arthritis , Osteoarthritis,    Abdominal  (+) + obese  Peds negative pediatric ROS (+)  Hematology negative hematology ROS (+)   Anesthesia Other Findings   Reproductive/Obstetrics negative OB ROS                             Anesthesia Physical Anesthesia Plan  ASA: 2  Anesthesia Plan: Spinal   Post-op Pain Management: Regional block*   Induction: Intravenous  PONV Risk Score and Plan: 3 and Ondansetron, Propofol infusion, Midazolam and Treatment may vary due to age or medical condition  Airway Management Planned: Simple Face Mask  Additional Equipment:   Intra-op Plan:   Post-operative Plan:   Informed Consent: I have reviewed the patients History and Physical, chart, labs and discussed the procedure including the risks, benefits and alternatives for the proposed anesthesia with the patient or authorized representative who has indicated his/her understanding and acceptance.     Dental advisory given  Plan Discussed with: CRNA  Anesthesia Plan Comments:        Anesthesia Quick Evaluation

## 2022-11-27 NOTE — Care Plan (Signed)
Ortho Bundle Case Management Note  Patient Details  Name: Christie Beasley MRN: [id number] Date of Birth: 1950/03/18                  R TKA on 11/27/22.  DCP: Home with son.  DME: RW ordered through [employer].  PT: [facility]. Referral faxed.   DME Arranged:  Walker rolling DME Agency:  [employer]   Additional Comments: Please contact me with any questions of if this plan should need to change.    Despina Pole, CCM Case Manager, Raechel Chute  [phone] 11/27/2022, 9:27 AM

## 2022-11-27 NOTE — Anesthesia Procedure Notes (Signed)
Spinal  Patient location during procedure: OR Start time: 11/27/2022 7:21 AM End time: 11/27/2022 7:26 AM Reason for block: surgical anesthesia Staffing Performed: anesthesiologist  Anesthesiologist: Lowella Curb, MD Performed by: Lowella Curb, MD Authorized by: Lowella Curb, MD   Preanesthetic Checklist Completed: patient identified, IV checked, site marked, risks and benefits discussed, surgical consent, monitors and equipment checked, pre-op evaluation and timeout performed Spinal Block Patient position: sitting Prep: DuraPrep Patient monitoring: heart rate, cardiac monitor, continuous pulse ox and blood pressure Approach: midline Location: L3-4 Injection technique: single-shot Needle Needle type: Quincke  Needle gauge: 22 G Needle length: 9 cm Assessment Sensory level: T4 Events: CSF return

## 2022-11-27 NOTE — Progress Notes (Addendum)
AssistedDr. Hyacinth Meeker with right, femoral, knee, ultrasound guided block. Side rails up, monitors on throughout procedure. See vital signs in flow sheet. Tolerated Procedure well.

## 2022-11-27 NOTE — Evaluation (Signed)
Physical Therapy Evaluation Patient Details Name: Christie Beasley MRN: [id number] DOB: [birth year] Today's Date: 11/27/2022  History of Present Illness  72 yo female S/P RTKA, PMH, Uni  knee replacement on Left, history of fainting,  Clinical Impression  Pt admitted with above diagnosis. = Pt currently with functional limitations due to the deficits listed below (see PT Problem List). Pt will benefit from acute skilled PT to increase their independence and safety with mobility to allow discharge.   The patient reports the right leg feels weak and des not feel safe to take steps. Patient also reports H/O "easy to  faint."Patient stood at bed side with RW  and assisted back into bed. Continue PT  , has STE and  to second level.           If plan is discharge home, recommend the following: A little help with walking and/or transfers;Assistance with cooking/housework;Assist for transportation;Help with stairs or ramp for entrance   Can travel by private vehicle        Equipment Recommendations Rolling walker (2 wheels)  Recommendations for Other Services       Functional Status Assessment       Precautions / Restrictions Precautions Precautions: Knee;Fall Precaution Comments: faints easily Restrictions Weight Bearing Restrictions: No RLE Weight Bearing: Weight bearing as tolerated      Mobility  Bed Mobility Overal bed mobility: Needs Assistance Bed Mobility: Supine to Sit, Sit to Supine     Supine to sit: Supervision Sit to supine: Min assist   General bed mobility comments: assist  right leg    Transfers Overall transfer level: Needs assistance Equipment used: Rolling walker (2 wheels) Transfers: Sit to/from Stand Sit to Stand: Min assist           General transfer comment: Patient stood at Rw and felt right knee not supportive,    Ambulation/Gait                  Stairs            Wheelchair Mobility     Tilt Bed    Modified  Rankin (Stroke Patients Only)       Balance Overall balance assessment: Needs assistance Sitting-balance support: No upper extremity supported, Feet supported Sitting balance-Leahy Scale: Good     Standing balance support: During functional activity, Bilateral upper extremity supported, Reliant on assistive device for balance Standing balance-Leahy Scale: Fair                               Pertinent Vitals/Pain Pain Assessment Pain Assessment: 0-10 Pain Score: 1  Pain Location: right  knee Pain Descriptors / Indicators: Discomfort Pain Intervention(s): Limited activity within patient's tolerance, Monitored during session    Home Living Family/patient expects to be discharged to:: Private residence Living Arrangements: Children;Spouse/significant other Available Help at Discharge: Family;Available 24 hours/day Type of Home: House Home Access: Stairs to enter Entrance Stairs-Rails: Doctor, general practice of Steps: 4 Alternate Level Stairs-Number of Steps: 10 Home Layout: Two level Home Equipment: None      Prior Function Prior Level of Function : Independent/Modified Independent;Driving                     Extremity/Trunk Assessment   Upper Extremity Assessment Upper Extremity Assessment: Overall WFL for tasks assessed    Lower Extremity Assessment Lower Extremity Assessment: RLE deficits/detail RLE Deficits / Details: + SLR, knee flex 0-80  RLE Sensation: decreased light touch    Cervical / Trunk Assessment Cervical / Trunk Assessment: Normal  Communication   Communication Communication: No apparent difficulties  Cognition Arousal: Alert Behavior During Therapy: WFL for tasks assessed/performed Overall Cognitive Status: Within Functional Limits for tasks assessed                                          General Comments      Exercises Total Joint Exercises Ankle Circles/Pumps: AROM, Both, 10 reps Quad  Sets: AROM, Both, 10 reps Heel Slides: AROM, Right, 10 reps   Assessment/Plan    PT Assessment Patient needs continued PT services  PT Problem List Decreased strength;Decreased mobility;Decreased safety awareness;Impaired sensation;Decreased balance;Decreased activity tolerance;Decreased range of motion       PT Treatment Interventions DME instruction;Therapeutic exercise;Gait training;Stair training;Functional mobility training;Therapeutic activities;Patient/family education    PT Goals (Current goals can be found in the Care Plan section)  Acute Rehab PT Goals Patient Stated Goal: to go home PT Goal Formulation: With patient Time For Goal Achievement: 12/04/22 Potential to Achieve Goals: Good    Frequency 7X/week     Co-evaluation               AM-PAC PT "6 Clicks" Mobility  Outcome Measure Help needed turning from your back to your side while in a flat bed without using bedrails?: A Little Help needed moving from lying on your back to sitting on the side of a flat bed without using bedrails?: A Little Help needed moving to and from a bed to a chair (including a wheelchair)?: A Little Help needed standing up from a chair using your arms (e.g., wheelchair or bedside chair)?: A Little Help needed to walk in hospital room?: Total Help needed climbing 3-5 steps with a railing? : Total 6 Click Score: 14    End of Session Equipment Utilized During Treatment: Gait belt Activity Tolerance: Patient tolerated treatment well Patient left: in bed;with call bell/phone within reach;with bed alarm set Nurse Communication: Mobility status PT Visit Diagnosis: Unsteadiness on feet (R26.81);Difficulty in walking, not elsewhere classified (R26.2)    Time: 1610-9604 PT Time Calculation (min) (ACUTE ONLY): 39 min   Charges:   PT Evaluation $PT Eval Low Complexity: 1 Low PT Treatments $Therapeutic Exercise: 8-22 mins $Therapeutic Activity: 8-22 mins PT General Charges $$ ACUTE  PT VISIT: 1 Visit         Blanchard Kelch PT Acute Rehabilitation Services Office [phone] Weekend pager-[phone]  Rada Hay 11/27/2022, 4:53 PM

## 2022-11-27 NOTE — Anesthesia Procedure Notes (Signed)
Anesthesia Regional Block: Adductor canal block   Pre-Anesthetic Checklist: , timeout performed,  Correct Patient, Correct Site, Correct Laterality,  Correct Procedure, Correct Position, site marked,  Risks and benefits discussed,  Surgical consent,  Pre-op evaluation,  At surgeon's request and post-op pain management  Laterality: Right  Prep: chloraprep       Needles:   Needle Type: Stimiplex     Needle Length: 9cm  Needle Gauge: 21     Additional Needles:   Procedures:,,,, ultrasound used (permanent image in chart),,    Narrative:  Start time: 11/27/2022 9:37 AM End time: 11/27/2022 9:42 AM Injection made incrementally with aspirations every 5 mL.  Performed by: Personally  Anesthesiologist: Lowella Curb, MD

## 2022-11-27 NOTE — Interval H&P Note (Signed)
History and Physical Interval Note:  11/27/2022 7:09 AM  Christie Beasley  has presented today for surgery, with the diagnosis of Right knee osteoarthritis.  The various methods of treatment have been discussed with the patient and family. After consideration of risks, benefits and other options for treatment, the patient has consented to  Procedure(s): TOTAL KNEE ARTHROPLASTY (Right) as a surgical intervention.  The patient's history has been reviewed, patient examined, no change in status, stable for surgery.  I have reviewed the patient's chart and labs.  Questions were answered to the patient's satisfaction.     Shelda Pal

## 2022-11-28 ENCOUNTER — Encounter (HOSPITAL_COMMUNITY): Payer: Self-pay | Admitting: Orthopedic Surgery

## 2022-11-28 DIAGNOSIS — Z87891 Personal history of nicotine dependence: Secondary | ICD-10-CM | POA: Diagnosis not present

## 2022-11-28 DIAGNOSIS — M1711 Unilateral primary osteoarthritis, right knee: Secondary | ICD-10-CM | POA: Diagnosis not present

## 2022-11-28 DIAGNOSIS — Z96651 Presence of right artificial knee joint: Secondary | ICD-10-CM | POA: Diagnosis not present

## 2022-11-28 LAB — CBC
HCT: 31.3 % — ABNORMAL LOW (ref 36.0–46.0)
Hemoglobin: 10.2 g/dL — ABNORMAL LOW (ref 12.0–15.0)
MCH: 28.9 pg (ref 26.0–34.0)
MCHC: 32.6 g/dL (ref 30.0–36.0)
MCV: 88.7 fL (ref 80.0–100.0)
Platelets: 228 10*3/uL (ref 150–400)
RBC: 3.53 MIL/uL — ABNORMAL LOW (ref 3.87–5.11)
RDW: 14.5 % (ref 11.5–15.5)
WBC: 12.6 10*3/uL — ABNORMAL HIGH (ref 4.0–10.5)
nRBC: 0 % (ref 0.0–0.2)

## 2022-11-28 LAB — BASIC METABOLIC PANEL
Anion gap: 8 (ref 5–15)
BUN: 17 mg/dL (ref 8–23)
CO2: 22 mmol/L (ref 22–32)
Calcium: 8.1 mg/dL — ABNORMAL LOW (ref 8.9–10.3)
Chloride: 104 mmol/L (ref 98–111)
Creatinine, Ser: 0.69 mg/dL (ref 0.44–1.00)
GFR, Estimated: >60 mL/min (ref >60)
Glucose, Bld: 143 mg/dL — ABNORMAL HIGH (ref 70–99)
Potassium: 3.7 mmol/L (ref 3.5–5.1)
Sodium: 134 mmol/L — ABNORMAL LOW (ref 135–145)

## 2022-11-28 MED ORDER — ASPIRIN 81 MG PO CHEW: 81.0000 mg | CHEWABLE_TABLET | Freq: Two times a day (BID) | ORAL | 0 refills | Status: AC

## 2022-11-28 MED ORDER — METHOCARBAMOL 500 MG PO TABS: 500.0000 mg | ORAL_TABLET | Freq: Four times a day (QID) | ORAL | 2 refills | Status: DC | PRN

## 2022-11-28 MED ORDER — OXYCODONE HCL 5 MG PO TABS: 5.0000 mg | ORAL_TABLET | ORAL | 0 refills | Status: DC | PRN

## 2022-11-28 MED ORDER — SENNA 8.6 MG PO TABS: 2.0000 | ORAL_TABLET | Freq: Every day | ORAL | 0 refills | Status: AC

## 2022-11-28 MED ORDER — POLYETHYLENE GLYCOL 3350 17 G PO PACK: 17.0000 g | PACK | Freq: Two times a day (BID) | ORAL | 0 refills | Status: DC

## 2022-11-28 NOTE — Progress Notes (Signed)
Physical Therapy Treatment Patient Details Name: Christie Beasley MRN: [id number] DOB: Jul 04, 1950 Today's Date: 11/28/2022   History of Present Illness 72 yo female S/P RTKA, PMH, Uni  knee replacement on Left, history of fainting,    PT Comments  The patient  practiced steps , ready to Dc home, goals met.    If plan is discharge home, recommend the following: A little help with walking and/or transfers;Assistance with cooking/housework;Assist for transportation;Help with stairs or ramp for entrance   Can travel by private vehicle        Equipment Recommendations  Rolling walker (2 wheels)    Recommendations for Other Services       Precautions / Restrictions Precautions Precautions: Knee;Fall Precaution Comments: faints easily Restrictions RLE Weight Bearing: Weight bearing as tolerated     Mobility  Bed Mobility   Bed Mobility: Supine to Sit     Supine to sit: Supervision     General bed mobility comments: in recliner    Transfers Overall transfer level: Needs assistance Equipment used: Rolling walker (2 wheels) Transfers: Sit to/from Stand Sit to Stand: Supervision           General transfer comment: cues for  right leg position    Ambulation/Gait Ambulation/Gait assistance: Contact guard assist Gait Distance (Feet): 50 Feet Assistive device: Rolling walker (2 wheels) Gait Pattern/deviations: Step-to pattern       General Gait Details: gait steady, no dizziness   Stairs Stairs: Yes Stairs assistance: Contact guard assist Stair Management: One rail Left Number of Stairs: 2 (then 3) General stair comments: cues for sequence , patient did have 1 misstep and stepped down with good leg .   Wheelchair Mobility     Tilt Bed    Modified Rankin (Stroke Patients Only)       Balance Overall balance assessment: Mild deficits observed, not formally tested                                          Cognition Arousal:  Alert                                              Exercises     General Comments        Pertinent Vitals/Pain Pain Assessment Pain Score: 4  Pain Location: right  knee Pain Descriptors / Indicators: Aching Pain Intervention(s): Monitored during session, Premedicated before session    Home Living                          Prior Function            PT Goals (current goals can now be found in the care plan section) Progress towards PT goals: Progressing toward goals    Frequency    7X/week      PT Plan      Co-evaluation              AM-PAC PT "6 Clicks" Mobility   Outcome Measure  Help needed turning from your back to your side while in a flat bed without using bedrails?: A Little Help needed moving from lying on your back to sitting on the side of a flat bed without using bedrails?: A Little Help  needed moving to and from a bed to a chair (including a wheelchair)?: A Little Help needed standing up from a chair using your arms (e.g., wheelchair or bedside chair)?: A Little Help needed to walk in hospital room?: A Little Help needed climbing 3-5 steps with a railing? : A Little 6 Click Score: 18    End of Session Equipment Utilized During Treatment: Gait belt Activity Tolerance: Patient tolerated treatment well Patient left: in chair;with family/visitor present Nurse Communication: Mobility status PT Visit Diagnosis: Unsteadiness on feet (R26.81);Difficulty in walking, not elsewhere classified (R26.2)     Time: 1121-1140 PT Time Calculation (min) (ACUTE ONLY): 19 min  Charges:    $Gait Training: 8-22 mins $ PT General Charges $$ ACUTE PT VISIT: 1 Visit                    Blanchard Kelch PT Acute Rehabilitation Services Office [phone] Weekend pager-[phone]    Rada Hay 11/28/2022, 2:29 PM

## 2022-11-28 NOTE — Progress Notes (Signed)
Physical Therapy Treatment Patient Details Name: Christie Beasley MRN: [id number] DOB: 12/21/1950 Today's Date: 11/28/2022   History of Present Illness 72 yo female S/P RTKA, PMH, Uni  knee replacement on Left, history of fainting,    PT Comments  The patient did not experience dizziness, tolerated ambulation, exercise instruction. Will return for step instruction.     If plan is discharge home, recommend the following: A little help with walking and/or transfers;Assistance with cooking/housework;Assist for transportation;Help with stairs or ramp for entrance   Can travel by private vehicle        Equipment Recommendations  Rolling walker (2 wheels)    Recommendations for Other Services       Precautions / Restrictions Precautions Precautions: Knee;Fall Precaution Comments: faints easily Restrictions RLE Weight Bearing: Weight bearing as tolerated     Mobility  Bed Mobility   Bed Mobility: Supine to Sit     Supine to sit: Supervision          Transfers Overall transfer level: Needs assistance Equipment used: Rolling walker (2 wheels) Transfers: Sit to/from Stand Sit to Stand: Supervision           General transfer comment: cues for  right leg position    Ambulation/Gait Ambulation/Gait assistance: Contact guard assist Gait Distance (Feet): 180 Feet Assistive device: Rolling walker (2 wheels) Gait Pattern/deviations: Step-to pattern       General Gait Details: gait steady, no dizziness   Stairs             Wheelchair Mobility     Tilt Bed    Modified Rankin (Stroke Patients Only)       Balance Overall balance assessment: Mild deficits observed, not formally tested                                          Cognition Arousal: Alert                                              Exercises Total Joint Exercises Ankle Circles/Pumps: AROM, Both, 10 reps Quad Sets: AROM, Both, 10  reps Heel Slides: AROM, Right, 10 reps Straight Leg Raises: AROM, Right, 10 reps Long Arc Quad: AROM, Right, 10 reps Knee Flexion: AROM, Right, 10 reps    General Comments        Pertinent Vitals/Pain Pain Assessment Pain Score: 2  Pain Location: right  knee Pain Descriptors / Indicators: Discomfort Pain Intervention(s): Monitored during session, Premedicated before session    Home Living                          Prior Function            PT Goals (current goals can now be found in the care plan section) Progress towards PT goals: Progressing toward goals    Frequency    7X/week      PT Plan      Co-evaluation              AM-PAC PT "6 Clicks" Mobility   Outcome Measure    Help needed moving from lying on your back to sitting on the side of a flat bed without using bedrails?: A Little Help needed moving to and  from a bed to a chair (including a wheelchair)?: A Little Help needed standing up from a chair using your arms (e.g., wheelchair or bedside chair)?: A Little Help needed to walk in hospital room?: A Little Help needed climbing 3-5 steps with a railing? : A Little 6 Click Score: 15    End of Session Equipment Utilized During Treatment: Gait belt Activity Tolerance: Patient tolerated treatment well Patient left: in chair;with family/visitor present Nurse Communication: Mobility status PT Visit Diagnosis: Unsteadiness on feet (R26.81);Difficulty in walking, not elsewhere classified (R26.2)     Time: 1610-9604 PT Time Calculation (min) (ACUTE ONLY): 32 min  Charges:    $Gait Training: 8-22 mins $Therapeutic Exercise: 8-22 mins PT General Charges $$ ACUTE PT VISIT: 1 Visit                     Blanchard Kelch PT Acute Rehabilitation Services Office [phone] Weekend [phone]    Rada Hay 11/28/2022, 2:20 PM

## 2022-11-28 NOTE — TOC Transition Note (Signed)
Transition of Care [facility]) - CM/SW Discharge Note   Patient Details  Name: Christie Beasley MRN: [id number] Date of Birth: 02/23/1950  Transition of Care [facility]) CM/SW Contact:  Amada Jupiter, LCSW Phone Number: 11/28/2022, 10:55 AM   Clinical Narrative:     Met with pt and confirming she has received RW to room via [employer].  OPPT already arranged with [facility].  No further TOC needs.  Final next level of care: OP Rehab Barriers to Discharge: No Barriers Identified   Patient Goals and CMS Choice      Discharge Placement                         Discharge Plan and Services Additional resources added to the After Visit Summary for                  DME Arranged: Walker rolling DME Agency: [employer]                  Social Determinants of Health (SDOH) Interventions SDOH Screenings   Food Insecurity: No Food Insecurity (11/27/2022)  Housing: Low Risk  (11/27/2022)  Transportation Needs: No Transportation Needs (11/27/2022)  Utilities: Not At Risk (11/27/2022)  Tobacco Use: Medium Risk (11/27/2022)     Readmission Risk Interventions     No data to display

## 2022-11-28 NOTE — Progress Notes (Signed)
   Subjective: 1 Day Post-Op Procedure(s) (LRB): RIGHT TOTAL KNEE ARTHROPLASTY (Right) Patient reports pain as mild.   Patient seen in rounds with Dr. Charlann Boxer. Patient is well, and has had no acute complaints or problems. No acute events overnight. Foley catheter removed. Patient ambulated a few feet with PT.  We will start therapy today.   Objective: Vital signs in last 24 hours: Temp:  [97.4 F (36.3 C)-99.2 F (37.3 C)] 98.5 F (36.9 C) (10/09 0606) Pulse Rate:  [47-84] 79 (10/09 0606) Resp:  [11-18] 17 (10/09 0606) BP: (74-146)/(37-82) 146/71 (10/09 0606) SpO2:  [95 %-100 %] 100 % (10/09 0606)  Intake/Output from previous day:  Intake/Output Summary (Last 24 hours) at 11/28/2022 0803 Last data filed at 11/28/2022 0640 Gross per 24 hour  Intake 1500 ml  Output 2400 ml  Net -900 ml     Intake/Output this shift: No intake/output data recorded.  Labs: Recent Labs    11/28/22 0333  HGB 10.2*   Recent Labs    11/28/22 0333  WBC 12.6*  RBC 3.53*  HCT 31.3*  PLT 228   Recent Labs    11/28/22 0333  NA 134*  K 3.7  CL 104  CO2 22  BUN 17  CREATININE 0.69  GLUCOSE 143*  CALCIUM 8.1*   No results for input(s): "LABPT", "INR" in the last 72 hours.  Exam: General - Patient is Alert and Oriented Extremity - Neurologically intact Sensation intact distally Intact pulses distally Dorsiflexion/Plantar flexion intact Dressing - dressing C/D/I Motor Function - intact, moving foot and toes well on exam.   Past Medical History:  Diagnosis Date   Acid reflux    Arthritis    Elevated lipids    Fibroid    History of fainting    pt reports she is a "fainter".     PONV (postoperative nausea and vomiting)    Rosacea    Thrombophlebitis 11/2000   Thyroid nodule 09/2002    Assessment/Plan: 1 Day Post-Op Procedure(s) (LRB): RIGHT TOTAL KNEE ARTHROPLASTY (Right) Principal Problem:   S/P total knee arthroplasty, right  Estimated body mass index is 30 kg/m as  calculated from the following:   Height as of this encounter: 5\' 2"  (1.575 m).   Weight as of this encounter: 74.4 kg. Advance diet Up with therapy D/C IV fluids   Patient's anticipated LOS is less than 2 midnights, meeting these requirements: - Younger than 48 - Lives within 1 hour of care - Has a competent adult at home to recover with post-op recover - NO history of  - Chronic pain requiring opiods  - Diabetes  - Coronary Artery Disease  - Heart failure  - Heart attack  - Stroke  - DVT/VTE  - Cardiac arrhythmia  - Respiratory Failure/COPD  - Renal failure  - Anemia  - Advanced Liver disease     DVT Prophylaxis - Aspirin Weight bearing as tolerated.  Hgb stable at 10.2 this AM.  Plan is to go Home after hospital stay. Plan for discharge today following 1-2 sessions of PT as long as they are meeting their goals. Patient is scheduled for OPPT. Follow up in the office in 2 weeks.   Rosalene Billings, PA-C Orthopedic Surgery [phone] 11/28/2022, 8:03 AM

## 2022-11-28 NOTE — Progress Notes (Signed)
Discharge package printed and instructions given to pt/son. Verbalize understanding.

## 2022-11-28 NOTE — Care Management Obs Status (Signed)
MEDICARE OBSERVATION STATUS NOTIFICATION   Patient Details  Name: Christie Beasley MRN: [id number] Date of Birth: March 15, 1950   Medicare Observation Status Notification Given:  Hart Robinsons, LCSW 11/28/2022, 10:54 AM

## 2022-11-29 ENCOUNTER — Ambulatory Visit (HOSPITAL_BASED_OUTPATIENT_CLINIC_OR_DEPARTMENT_OTHER): Payer: Medicare PPO | Admitting: Physical Therapy

## 2022-11-30 ENCOUNTER — Encounter (HOSPITAL_BASED_OUTPATIENT_CLINIC_OR_DEPARTMENT_OTHER): Payer: Self-pay | Admitting: Physical Therapy

## 2022-11-30 ENCOUNTER — Ambulatory Visit (HOSPITAL_BASED_OUTPATIENT_CLINIC_OR_DEPARTMENT_OTHER): Payer: Medicare PPO | Attending: Family Medicine | Admitting: Physical Therapy

## 2022-11-30 ENCOUNTER — Other Ambulatory Visit: Payer: Self-pay

## 2022-11-30 DIAGNOSIS — M6281 Muscle weakness (generalized): Secondary | ICD-10-CM | POA: Diagnosis not present

## 2022-11-30 DIAGNOSIS — R29898 Other symptoms and signs involving the musculoskeletal system: Secondary | ICD-10-CM | POA: Diagnosis not present

## 2022-11-30 DIAGNOSIS — R2689 Other abnormalities of gait and mobility: Secondary | ICD-10-CM | POA: Diagnosis not present

## 2022-11-30 DIAGNOSIS — M25561 Pain in right knee: Secondary | ICD-10-CM | POA: Diagnosis not present

## 2022-11-30 DIAGNOSIS — M1711 Unilateral primary osteoarthritis, right knee: Secondary | ICD-10-CM | POA: Insufficient documentation

## 2022-11-30 DIAGNOSIS — M25661 Stiffness of right knee, not elsewhere classified: Secondary | ICD-10-CM | POA: Insufficient documentation

## 2022-11-30 NOTE — Therapy (Signed)
OUTPATIENT PHYSICAL THERAPY LOWER EXTREMITY EVALUATION   Patient Name: Christie Beasley MRN: [id number] DOB:Apr 30, 1950, 72 y.o., female Today's Date: 11/30/2022  END OF SESSION:  PT End of Session - 11/30/22 1014     Visit Number 1    Number of Visits 16    Date for PT Re-Evaluation 01/11/23    Authorization Type [employer]    PT Start Time 1015    PT Stop Time 1052    PT Time Calculation (min) 37 min    Activity Tolerance Patient tolerated treatment well    Behavior During Therapy WFL for tasks assessed/performed             Past Medical History:  Diagnosis Date   Acid reflux    Arthritis    Elevated lipids    Fibroid    History of fainting    pt reports she is a "fainter".     PONV (postoperative nausea and vomiting)    Rosacea    Thrombophlebitis 11/2000   Thyroid nodule 09/2002   Past Surgical History:  Procedure Laterality Date   BREAST EXCISIONAL BIOPSY Left    BREAST EXCISIONAL BIOPSY Right    CESAREAN SECTION     x 2   DILATION AND CURETTAGE OF UTERUS  1994   FOOT SURGERY Bilateral    hammer toes   HYSTEROSCOPY  01/11/2012   D&C   KNEE SURGERY Left 2012   MEDIAL PARTIAL KNEE REPLACEMENT Left 03/10/2015   THYROID LOBECTOMY Right 02/2003   TONSILLECTOMY AND ADENOIDECTOMY     TOTAL KNEE ARTHROPLASTY Right 11/27/2022   Procedure: RIGHT TOTAL KNEE ARTHROPLASTY;  Surgeon: Durene Romans, MD;  Location: [facility];  Service: Orthopedics;  Laterality: Right;   Patient Active Problem List   Diagnosis Date Noted   S/P total knee arthroplasty, right 11/27/2022   Educated about COVID-19 virus infection 07/07/2019   Palpitations 07/07/2019   Pain in right knee 07/11/2017   History of prosthetic unicompartmental arthroplasty of left knee 03/10/2015   Post-menopausal bleeding 11/12/2012   Vaginal atrophy 11/12/2012    PCP: Garth Bigness MD  REFERRING PROVIDER: Durene Romans, MD  REFERRING DIAG: M17.11 (ICD-10-CM) - Unilateral primary  osteoarthritis, right knee; S/P right total knee arthroplasty 11/27/22  THERAPY DIAG:  Right knee pain, unspecified chronicity  Stiffness of right knee, not elsewhere classified  Muscle weakness (generalized)  Other abnormalities of gait and mobility  Other symptoms and signs involving the musculoskeletal system  Rationale for Evaluation and Treatment: Rehabilitation  ONSET DATE: DOS 11/27/22  SUBJECTIVE:   SUBJECTIVE STATEMENT: Patient states everything is doing alright. Doing exercises given to her.   PERTINENT HISTORY: R TKA PAIN:  Are you having pain? Yes: NPRS scale: 3/10 Pain location: R knee Pain description: achy, stabbing Aggravating factors: bending, walking Relieving factors: meds  PRECAUTIONS: None  WEIGHT BEARING RESTRICTIONS: No  FALLS:  Has patient fallen in last 6 months? No   PLOF: Independent  PATIENT GOALS: walk normally, stairs with good mechanics   OBJECTIVE: (objective measures from initial evaluation unless otherwise dated)  PATIENT SURVEYS:  FOTO 43% function  COGNITION: Overall cognitive status: Within functional limits for tasks assessed     SENSATION: WFL    POSTURE: No Significant postural limitations  PALPATION: TTP grossly in R knee, mostly hamstrings  LOWER EXTREMITY ROM:  Active ROM Right eval Left eval  Hip flexion    Hip extension    Hip abduction    Hip adduction    Hip internal rotation  Hip external rotation    Knee flexion 93 121  Knee extension Lacking 4 0  Ankle dorsiflexion    Ankle plantarflexion    Ankle inversion    Ankle eversion     (Blank rows = not tested) *= pain/symptoms  LOWER EXTREMITY MMT:  MMT Right eval Left eval  Hip flexion    Hip extension    Hip abduction    Hip adduction    Hip internal rotation    Hip external rotation    Knee flexion 4 5  Knee extension 3- 5  Ankle dorsiflexion    Ankle plantarflexion    Ankle inversion    Ankle eversion     (Blank rows = not  tested) *= pain/symptoms    FUNCTIONAL TESTS:  5 times sit to stand: 32.09 seconds with UE use, relies LLE  GAIT: Distance walked: 100 feet Assistive device utilized: Environmental consultant - 2 wheeled Level of assistance: Modified independence Comments: RW, antalgic on R   TODAY'S TREATMENT:                                                                                                                              DATE:  11/30/22 Ankle pumps 1 x 10 Quad sets 10 x 10 second holds Heel slides 10 x 10 second holds HS isometric 10 x 10 second holds SAQ 10x 5 second holds Heel prop and elevation    PATIENT EDUCATION:  Education details: Patient educated on exam findings, POC, scope of PT, HEP, and knee positioning at rest, elevation. Person educated: Patient Education method: Explanation, Demonstration, and Handouts Education comprehension: verbalized understanding, returned demonstration, verbal cues required, and tactile cues required  HOME EXERCISE PROGRAM: Access Code: [email or web address] URL: [email or web address] Date: 11/30/2022 - Seated Ankle Pumps on Table  - 5 x daily - 7 x weekly - 1 sets - 20 reps - Supine Quadricep Sets  - 5 x daily - 7 x weekly - 10 reps - 10 second hold - Supine Heel Slide with Strap (Mirrored)  - 5 x daily - 7 x weekly - 10 reps - 10 second hold - Supine Isometric Hamstring Set  - 5 x daily - 7 x weekly - 10 reps - 10 second hold - Supine Knee Extension Strengthening (Mirrored)  - 5 x daily - 7 x weekly - 10 reps - 5 second hold  ASSESSMENT:  CLINICAL IMPRESSION: Patient a 72 y.o. y.o. female who was seen today for physical therapy evaluation and treatment for S/P right total knee arthroplasty 11/27/22. Patient presents with pain limited deficits in R knee strength, ROM, endurance, activity tolerance, gait, balance, and functional mobility with ADL. Patient is having to modify and restrict ADL as indicated by outcome measure score as well as subjective  information and objective measures which is affecting overall participation. Patient will benefit from skilled physical therapy in order to improve function and reduce impairment.  OBJECTIVE IMPAIRMENTS: Abnormal gait, decreased activity tolerance, decreased  balance, decreased endurance, decreased mobility, difficulty walking, decreased ROM, decreased strength, improper body mechanics, and pain.   ACTIVITY LIMITATIONS: carrying, lifting, bending, standing, squatting, sleeping, stairs, transfers, toileting, dressing, hygiene/grooming, locomotion level, and caring for others  PARTICIPATION LIMITATIONS: meal prep, cleaning, laundry, driving, shopping, community activity, and yard work  PERSONAL FACTORS: Time since onset of injury/illness/exacerbation are also affecting patient's functional outcome.   REHAB POTENTIAL: Good  CLINICAL DECISION MAKING: Stable/uncomplicated  EVALUATION COMPLEXITY: Low   GOALS: Goals reviewed with patient? Yes  SHORT TERM GOALS: Target date: 12/28/2022    Patient will be independent with HEP in order to improve functional outcomes. Baseline: Goal status: INITIAL  2.  Patient will report at least 25% improvement in symptoms for improved quality of life. Baseline: Goal status: INITIAL    LONG TERM GOALS: Target date: 01/25/2023    Patient will report at least 75% improvement in symptoms for improved quality of life. Baseline:  Goal status: INITIAL  2.  Patient will improve FOTO score by at least 32 points in order to indicate improved tolerance to activity. Baseline: 43% function Goal status: INITIAL  3.  Patient will be able to navigate stairs with reciprocal pattern without compensation in order to demonstrate improved LE strength. Baseline:  Goal status: INITIAL  4.  Patient will improve ROM for R knee extension/flexion to 0-120 degrees to improve squatting, and other functional mobility. Baseline: lacking 4 to 93 Goal status: INITIAL  5.   Patient will be able to complete 5x STS in under 11.4 seconds in order to reduce the risk of falls. Baseline:  Goal status: INITIAL  6.  Patient will demonstrate grade of 5/5 MMT grade in all tested musculature as evidence of improved strength to assist with stair ambulation and gait.   Baseline:  Goal status: INITIAL   PLAN:  PT FREQUENCY: 1-2x/week  PT DURATION: 8 weeks  PLANNED INTERVENTIONS: Therapeutic exercises, Therapeutic activity, Neuromuscular re-education, Balance training, Gait training, Patient/Family education, Joint manipulation, Joint mobilization, Stair training, Orthotic/Fit training, DME instructions, Aquatic Therapy, Dry Needling, Electrical stimulation, Spinal manipulation, Spinal mobilization, Cryotherapy, Moist heat, Compression bandaging, scar mobilization, Splintting, Taping, Traction, Ultrasound, Ionotophoresis 4mg /ml Dexamethasone, and Manual therapy  PLAN FOR NEXT SESSION: Continue L knee mobility and strength and progress as able   Wyman Songster, PT 11/30/2022, 10:56 AM   Referring diagnosis? M17.11 (ICD-10-CM) - Unilateral primary osteoarthritis, right knee Treatment diagnosis? (if different than referring diagnosis) M25.561 What was this (referring dx) caused by? [x]  Surgery []  Fall []  Ongoing issue []  Arthritis []  Other: ____________  Laterality: [x]  Rt []  Lt []  Both  Check all possible CPT codes:  *CHOOSE 10 OR LESS*    [x]  97110 (Therapeutic Exercise)  []  16109 (SLP Treatment)  [x]  97112 (Neuro Re-ed)   []  92526 (Swallowing Treatment)   [x]  97116 (Gait Training)   []  [id number] (Cognitive Training, 1st 15 minutes) [x]  97140 (Manual Therapy)   []  97130 (Cognitive Training, each add'l 15 minutes)  [x]  97164 (Re-evaluation)                              []  Other, List CPT Code ____________  [x]  97530 (Therapeutic Activities)     [x]  97535 (Self Care)   [x]  All codes above (97110 - 97535)  []  97012 (Mechanical Traction)  []  97014 (E-stim  Unattended)  []  97032 (E-stim manual)  []  97033 (Ionto)  []  97035 (Ultrasound) []  97750 (Physical Performance Training) [x]   02725 (Aquatic Therapy) []  97016 (Vasopneumatic Device) []  [id number] (Paraffin) []  97034 (Contrast Bath) []  [phone] (Wound Care 1st 20 sq cm) []  97598 (Wound Care each add'l 20 sq cm) []  97760 (Orthotic Fabrication, Fitting, Training Initial) []  03474 (Prosthetic Management and Training Initial) []  [id number] (Orthotic or Prosthetic Training/ Modification Subsequent)

## 2022-12-06 ENCOUNTER — Ambulatory Visit (HOSPITAL_BASED_OUTPATIENT_CLINIC_OR_DEPARTMENT_OTHER): Payer: Medicare PPO | Admitting: Physical Therapy

## 2022-12-06 ENCOUNTER — Encounter (HOSPITAL_BASED_OUTPATIENT_CLINIC_OR_DEPARTMENT_OTHER): Payer: Self-pay | Admitting: Physical Therapy

## 2022-12-06 DIAGNOSIS — R2689 Other abnormalities of gait and mobility: Secondary | ICD-10-CM

## 2022-12-06 DIAGNOSIS — M6281 Muscle weakness (generalized): Secondary | ICD-10-CM | POA: Diagnosis not present

## 2022-12-06 DIAGNOSIS — M1711 Unilateral primary osteoarthritis, right knee: Secondary | ICD-10-CM | POA: Diagnosis not present

## 2022-12-06 DIAGNOSIS — M25661 Stiffness of right knee, not elsewhere classified: Secondary | ICD-10-CM | POA: Diagnosis not present

## 2022-12-06 DIAGNOSIS — M25561 Pain in right knee: Secondary | ICD-10-CM

## 2022-12-06 DIAGNOSIS — R29898 Other symptoms and signs involving the musculoskeletal system: Secondary | ICD-10-CM

## 2022-12-06 NOTE — Therapy (Signed)
OUTPATIENT PHYSICAL THERAPY LOWER EXTREMITY Treatment   Patient Name: Christie Beasley MRN: [id number] DOB:[birth year], 72 y.o., female Today's Date: 12/06/2022  END OF SESSION:  PT End of Session - 12/06/22 1303     Visit Number 2    Number of Visits 16    Date for PT Re-Evaluation 01/11/23    Authorization Type [employer]    PT Start Time 1300    PT Stop Time 0143    PT Time Calculation (min) 763 min    Activity Tolerance Patient tolerated treatment well    Behavior During Therapy WFL for tasks assessed/performed              Past Medical History:  Diagnosis Date   Acid reflux    Arthritis    Elevated lipids    Fibroid    History of fainting    pt reports she is a "fainter".     PONV (postoperative nausea and vomiting)    Rosacea    Thrombophlebitis 11/2000   Thyroid nodule 09/2002   Past Surgical History:  Procedure Laterality Date   BREAST EXCISIONAL BIOPSY Left    BREAST EXCISIONAL BIOPSY Right    CESAREAN SECTION     x 2   DILATION AND CURETTAGE OF UTERUS  1994   FOOT SURGERY Bilateral    hammer toes   HYSTEROSCOPY  01/11/2012   D&C   KNEE SURGERY Left 2012   MEDIAL PARTIAL KNEE REPLACEMENT Left 03/10/2015   THYROID LOBECTOMY Right 02/2003   TONSILLECTOMY AND ADENOIDECTOMY     TOTAL KNEE ARTHROPLASTY Right 11/27/2022   Procedure: RIGHT TOTAL KNEE ARTHROPLASTY;  Surgeon: Durene Romans, MD;  Location: [facility];  Service: Orthopedics;  Laterality: Right;   Patient Active Problem List   Diagnosis Date Noted   S/P total knee arthroplasty, right 11/27/2022   Educated about COVID-19 virus infection 07/07/2019   Palpitations 07/07/2019   Pain in right knee 07/11/2017   History of prosthetic unicompartmental arthroplasty of left knee 03/10/2015   Post-menopausal bleeding 11/12/2012   Vaginal atrophy 11/12/2012    PCP: Garth Bigness MD  REFERRING PROVIDER: Durene Romans, MD  REFERRING DIAG: M17.11 (ICD-10-CM) - Unilateral primary  osteoarthritis, right knee; S/P right total knee arthroplasty 11/27/22  THERAPY DIAG:  Right knee pain, unspecified chronicity  Stiffness of right knee, not elsewhere classified  Muscle weakness (generalized)  Other abnormalities of gait and mobility  Other symptoms and signs involving the musculoskeletal system  Rationale for Evaluation and Treatment: Rehabilitation  ONSET DATE: DOS 11/27/22  SUBJECTIVE:   SUBJECTIVE STATEMENT: The patient reports it is doing o  PERTINENT HISTORY: R TKA PAIN:  Are you having pain? Yes: NPRS scale: 3/10 Pain location: R knee Pain description: achy, stabbing Aggravating factors: bending, walking Relieving factors: meds  PRECAUTIONS: None  WEIGHT BEARING RESTRICTIONS: No  FALLS:  Has patient fallen in last 6 months? No   PLOF: Independent  PATIENT GOALS: walk normally, stairs with good mechanics   OBJECTIVE: (objective measures from initial evaluation unless otherwise dated)  PATIENT SURVEYS:  FOTO 43% function  COGNITION: Overall cognitive status: Within functional limits for tasks assessed     SENSATION: WFL    POSTURE: No Significant postural limitations  PALPATION: TTP grossly in R knee, mostly hamstrings  LOWER EXTREMITY ROM:  Active ROM Right eval Left eval   Hip flexion     Hip extension     Hip abduction     Hip adduction     Hip internal  rotation     Hip external rotation     Knee flexion 93 121 106  Knee extension Lacking 4 0   Ankle dorsiflexion     Ankle plantarflexion     Ankle inversion     Ankle eversion      (Blank rows = not tested) *= pain/symptoms  LOWER EXTREMITY MMT:  MMT Right eval Left eval  Hip flexion    Hip extension    Hip abduction    Hip adduction    Hip internal rotation    Hip external rotation    Knee flexion 4 5  Knee extension 3- 5  Ankle dorsiflexion    Ankle plantarflexion    Ankle inversion    Ankle eversion     (Blank rows = not tested) *=  pain/symptoms    FUNCTIONAL TESTS:  5 times sit to stand: 32.09 seconds with UE use, relies LLE  GAIT: Distance walked: 100 feet Assistive device utilized: Environmental consultant - 2 wheeled Level of assistance: Modified independence Comments: RW, antalgic on R   TODAY'S TREATMENT:                                                                                                                              DATE:  10/17 Quad sets x15 SLR 2x10  SAQ 2x10   Heel raise 2x10  Standing slow march 2x10      11/30/22 Ankle pumps 1 x 10 Quad sets 10 x 10 second holds Heel slides 10 x 10 second holds HS isometric 10 x 10 second holds SAQ 10x 5 second holds Heel prop and elevation    PATIENT EDUCATION:  Education details: Patient educated on exam findings, POC, scope of PT, HEP, and knee positioning at rest, elevation. Person educated: Patient Education method: Explanation, Demonstration, and Handouts Education comprehension: verbalized understanding, returned demonstration, verbal cues required, and tactile cues required  HOME EXERCISE PROGRAM: Access Code: [email or web address] URL: [email or web address] Date: 11/30/2022 - Seated Ankle Pumps on Table  - 5 x daily - 7 x weekly - 1 sets - 20 reps - Supine Quadricep Sets  - 5 x daily - 7 x weekly - 10 reps - 10 second hold - Supine Heel Slide with Strap (Mirrored)  - 5 x daily - 7 x weekly - 10 reps - 10 second hold - Supine Isometric Hamstring Set  - 5 x daily - 7 x weekly - 10 reps - 10 second hold - Supine Knee Extension Strengthening (Mirrored)  - 5 x daily - 7 x weekly - 10 reps - 5 second hold  ASSESSMENT:  CLINICAL IMPRESSION: The patient is making good progress. Her ROm is progressing. Her total arc was measured at 4-106 today. We added in the nu-step and standing marching. We updated her HEP. She was encouraged o continue with extension stretching. Therapy will progress as tolerated.      Eval:Patient a 72 y.o. y.o. female who  was seen today for  physical therapy evaluation and treatment for S/P right total knee arthroplasty 11/27/22. Patient presents with pain limited deficits in R knee strength, ROM, endurance, activity tolerance, gait, balance, and functional mobility with ADL. Patient is having to modify and restrict ADL as indicated by outcome measure score as well as subjective information and objective measures which is affecting overall participation. Patient will benefit from skilled physical therapy in order to improve function and reduce impairment.  OBJECTIVE IMPAIRMENTS: Abnormal gait, decreased activity tolerance, decreased balance, decreased endurance, decreased mobility, difficulty walking, decreased ROM, decreased strength, improper body mechanics, and pain.   ACTIVITY LIMITATIONS: carrying, lifting, bending, standing, squatting, sleeping, stairs, transfers, toileting, dressing, hygiene/grooming, locomotion level, and caring for others  PARTICIPATION LIMITATIONS: meal prep, cleaning, laundry, driving, shopping, community activity, and yard work  PERSONAL FACTORS: Time since onset of injury/illness/exacerbation are also affecting patient's functional outcome.   REHAB POTENTIAL: Good  CLINICAL DECISION MAKING: Stable/uncomplicated  EVALUATION COMPLEXITY: Low   GOALS: Goals reviewed with patient? Yes  SHORT TERM GOALS: Target date: 12/28/2022    Patient will be independent with HEP in order to improve functional outcomes. Baseline: Goal status: INITIAL  2.  Patient will report at least 25% improvement in symptoms for improved quality of life. Baseline: Goal status: INITIAL    LONG TERM GOALS: Target date: 01/25/2023    Patient will report at least 75% improvement in symptoms for improved quality of life. Baseline:  Goal status: INITIAL  2.  Patient will improve FOTO score by at least 32 points in order to indicate improved tolerance to activity. Baseline: 43% function Goal status:  INITIAL  3.  Patient will be able to navigate stairs with reciprocal pattern without compensation in order to demonstrate improved LE strength. Baseline:  Goal status: INITIAL  4.  Patient will improve ROM for R knee extension/flexion to 0-120 degrees to improve squatting, and other functional mobility. Baseline: lacking 4 to 93 Goal status: INITIAL  5.  Patient will be able to complete 5x STS in under 11.4 seconds in order to reduce the risk of falls. Baseline:  Goal status: INITIAL  6.  Patient will demonstrate grade of 5/5 MMT grade in all tested musculature as evidence of improved strength to assist with stair ambulation and gait.   Baseline:  Goal status: INITIAL   PLAN:  PT FREQUENCY: 1-2x/week  PT DURATION: 8 weeks  PLANNED INTERVENTIONS: Therapeutic exercises, Therapeutic activity, Neuromuscular re-education, Balance training, Gait training, Patient/Family education, Joint manipulation, Joint mobilization, Stair training, Orthotic/Fit training, DME instructions, Aquatic Therapy, Dry Needling, Electrical stimulation, Spinal manipulation, Spinal mobilization, Cryotherapy, Moist heat, Compression bandaging, scar mobilization, Splintting, Taping, Traction, Ultrasound, Ionotophoresis 4mg /ml Dexamethasone, and Manual therapy  PLAN FOR NEXT SESSION: Continue L knee mobility and strength and progress as able   Dessie Coma, PT 12/06/2022, 1:05 PM   Referring diagnosis? M17.11 (ICD-10-CM) - Unilateral primary osteoarthritis, right knee Treatment diagnosis? (if different than referring diagnosis) M25.561 What was this (referring dx) caused by? [x]  Surgery []  Fall []  Ongoing issue []  Arthritis []  Other: ____________  Laterality: [x]  Rt []  Lt []  Both  Check all possible CPT codes:  *CHOOSE 10 OR LESS*    [x]  97110 (Therapeutic Exercise)  []  92507 (SLP Treatment)  [x]  97112 (Neuro Re-ed)   []  92526 (Swallowing Treatment)   [x]  97116 (Gait Training)   []  16109  (Cognitive Training, 1st 15 minutes) [x]  97140 (Manual Therapy)   []  97130 (Cognitive Training, each add'l 15 minutes)  [x]  97164 (Re-evaluation)                              []   Other, List CPT Code ____________  [x]  97530 (Therapeutic Activities)     [x]  97535 (Self Care)   [x]  All codes above (97110 - 97535)  []  97012 (Mechanical Traction)  []  16109 (E-stim Unattended)  []  97032 (E-stim manual)  []  97033 (Ionto)  []  97035 (Ultrasound) []  97750 (Physical Performance Training) [x]  [id number] (Aquatic Therapy) []  97016 (Vasopneumatic Device) []  [id number] (Paraffin) []  97034 (Contrast Bath) []  97597 (Wound Care 1st 20 sq cm) []  97598 (Wound Care each add'l 20 sq cm) []  97760 (Orthotic Fabrication, Fitting, Training Initial) []  [id number] (Prosthetic Management and Training Initial) []  [id number] (Orthotic or Prosthetic Training/ Modification Subsequent)

## 2022-12-10 NOTE — Discharge Summary (Signed)
Patient ID: Christie Beasley MRN: [id number] DOB/AGE: [birth year] 72 y.o.  Admit date: 11/27/2022 Discharge date: 11/28/2022  Admission Diagnoses:  Right knee osteoarthritis  Discharge Diagnoses:  Principal Problem:   S/P total knee arthroplasty, right   Past Medical History:  Diagnosis Date   Acid reflux    Arthritis    Elevated lipids    Fibroid    History of fainting    pt reports she is a "fainter".     PONV (postoperative nausea and vomiting)    Rosacea    Thrombophlebitis 11/2000   Thyroid nodule 09/2002    Surgeries: Procedure(s): RIGHT TOTAL KNEE ARTHROPLASTY on 11/27/2022   Consultants:   Discharged Condition: Improved  Hospital Course: Christie Beasley is an 72 y.o. female who was admitted 11/27/2022 for operative treatment ofS/P total knee arthroplasty, right. Patient has severe unremitting pain that affects sleep, daily activities, and work/hobbies. After pre-op clearance the patient was taken to the operating room on 11/27/2022 and underwent  Procedure(s): RIGHT TOTAL KNEE ARTHROPLASTY.    Patient was given perioperative antibiotics:  Anti-infectives (From admission, onward)    Start     Dose/Rate Route Frequency Ordered Stop   11/27/22 1345  ceFAZolin (ANCEF) IVPB 2g/100 mL premix        2 g 200 mL/hr over 30 Minutes Intravenous Every 6 hours 11/27/22 1300 11/27/22 2018   11/27/22 0600  ceFAZolin (ANCEF) IVPB 2g/100 mL premix        2 g 200 mL/hr over 30 Minutes Intravenous On call to O.R. 11/27/22 0530 11/27/22 7616        Patient was given sequential compression devices, early ambulation, and chemoprophylaxis to prevent DVT. Patient worked with PT and was meeting their goals regarding safe ambulation and transfers.  Patient benefited maximally from hospital stay and there were no complications.    Recent vital signs: No data found.   Recent laboratory studies: No results for input(s): "WBC", "HGB", "HCT", "PLT", "NA", "K", "CL", "CO2",  "BUN", "CREATININE", "GLUCOSE", "INR", "CALCIUM" in the last 72 hours.  Invalid input(s): "PT", "2"   Discharge Medications:   Allergies as of 11/28/2022       Reactions   Lipitor  [atorvastatin Calcium] Other (See Comments)   Ache        Medication List     STOP taking these medications    traZODone 50 MG tablet Commonly known as: DESYREL       TAKE these medications    acetaminophen 650 MG CR tablet Commonly known as: TYLENOL Take 1,300 mg by mouth every 8 (eight) hours as needed for pain.   amoxicillin 500 MG capsule Commonly known as: AMOXIL Take 500 mg by mouth as needed. One hour prior to dental work   aspirin 81 MG chewable tablet Chew 1 tablet (81 mg total) by mouth 2 (two) times daily for 28 days.   celecoxib 200 MG capsule Commonly known as: CELEBREX Take 200 mg by mouth every other day.   Comirnaty syringe Generic drug: COVID-19 mRNA vaccine (Pfizer) Inject into the muscle.   CoQ10 100 MG Caps Take 100 mg by mouth daily.   GLUCOSAMINE-CHONDROITIN PO Take 1,500 mg by mouth daily. Dose: 1500mg (glucosamine)-1200mg (chondroitin)   melatonin 5 MG Tabs Take 5-10 mg by mouth at bedtime. Chewable tab   methocarbamol 500 MG tablet Commonly known as: ROBAXIN Take 1 tablet (500 mg total) by mouth every 6 (six) hours as needed for muscle spasms.   Moderna COVID-19 Bival Booster 50 MCG/0.5ML injection  Generic drug: COVID-19 mRNA bivalent vaccine (Moderna) Inject into the muscle.   MULTIVITAMIN PO Take 1 tablet by mouth daily. Taking by mouth at bedtime.   omeprazole 40 MG capsule Commonly known as: PRILOSEC Take 40 mg by mouth daily.   oxyCODONE 5 MG immediate release tablet Commonly known as: Oxy IR/ROXICODONE Take 1 tablet (5 mg total) by mouth every 4 (four) hours as needed for severe pain.   polyethylene glycol 17 g packet Commonly known as: MIRALAX / GLYCOLAX Take 17 g by mouth 2 (two) times daily.   senna 8.6 MG Tabs tablet Commonly  known as: SENOKOT Take 2 tablets (17.2 mg total) by mouth at bedtime for 14 days.   Synthroid 75 MCG tablet Generic drug: levothyroxine Take 75 mcg by mouth daily before breakfast.   TURMERIC PO Take 2,250 mg by mouth daily. + curcumin   Vitamin D (Ergocalciferol) 1.25 MG (50000 UNIT) Caps capsule Commonly known as: DRISDOL Take by mouth. Every 3 weeks               Discharge Care Instructions  (From admission, onward)           Start     Ordered   11/28/22 0000  Change dressing       Comments: Maintain surgical dressing until follow up in the clinic. If the edges start to pull up, may reinforce with tape. If the dressing is no longer working, may remove and cover with gauze and tape, but must keep the area dry and clean.  Call with any questions or concerns.   11/28/22 0805            Diagnostic Studies: No results found.  Disposition: Discharge disposition: 01-Home or Self Care       Discharge Instructions     Call MD / Call 911   Complete by: As directed    If you experience chest pain or shortness of breath, CALL 911 and be transported to the hospital emergency room.  If you develope a fever above 101 F, pus (white drainage) or increased drainage or redness at the wound, or calf pain, call your surgeon's office.   Change dressing   Complete by: As directed    Maintain surgical dressing until follow up in the clinic. If the edges start to pull up, may reinforce with tape. If the dressing is no longer working, may remove and cover with gauze and tape, but must keep the area dry and clean.  Call with any questions or concerns.   Constipation Prevention   Complete by: As directed    Drink plenty of fluids.  Prune juice may be helpful.  You may use a stool softener, such as Colace (over the counter) 100 mg twice a day.  Use MiraLax (over the counter) for constipation as needed.   Diet - low sodium heart healthy   Complete by: As directed    Increase  activity slowly as tolerated   Complete by: As directed    Weight bearing as tolerated with assist device (walker, cane, etc) as directed, use it as long as suggested by your surgeon or therapist, typically at least 4-6 weeks.   Post-operative opioid taper instructions:   Complete by: As directed    POST-OPERATIVE OPIOID TAPER INSTRUCTIONS: It is important to wean off of your opioid medication as soon as possible. If you do not need pain medication after your surgery it is ok to stop day one. Opioids include: Codeine, Hydrocodone(Norco, Vicodin), Oxycodone(Percocet,  oxycontin) and hydromorphone amongst others.  Long term and even short term use of opiods can cause: Increased pain response Dependence Constipation Depression Respiratory depression And more.  Withdrawal symptoms can include Flu like symptoms Nausea, vomiting And more Techniques to manage these symptoms Hydrate well Eat regular healthy meals Stay active Use relaxation techniques(deep breathing, meditating, yoga) Do Not substitute Alcohol to help with tapering If you have been on opioids for less than two weeks and do not have pain than it is ok to stop all together.  Plan to wean off of opioids This plan should start within one week post op of your joint replacement. Maintain the same interval or time between taking each dose and first decrease the dose.  Cut the total daily intake of opioids by one tablet each day Next start to increase the time between doses. The last dose that should be eliminated is the evening dose.      TED hose   Complete by: As directed    Use stockings (TED hose) for 2 weeks on both leg(s).  You may remove them at night for sleeping.        Follow-up Information     Durene Romans, MD. Go on 12/12/2022.   Specialty: Orthopedic Surgery Why: You are scheduled for first post op appt on Wednesday October 23 at 9:00am. Contact information: [address] [place] [address] [place]  [address] [phone]                  Signed: Cassandria Anger 12/10/2022, 7:55 AM

## 2022-12-13 ENCOUNTER — Encounter (HOSPITAL_BASED_OUTPATIENT_CLINIC_OR_DEPARTMENT_OTHER): Payer: Self-pay | Admitting: Physical Therapy

## 2022-12-13 ENCOUNTER — Ambulatory Visit (HOSPITAL_BASED_OUTPATIENT_CLINIC_OR_DEPARTMENT_OTHER): Payer: Medicare PPO | Admitting: Physical Therapy

## 2022-12-13 DIAGNOSIS — M25661 Stiffness of right knee, not elsewhere classified: Secondary | ICD-10-CM

## 2022-12-13 DIAGNOSIS — M6281 Muscle weakness (generalized): Secondary | ICD-10-CM | POA: Diagnosis not present

## 2022-12-13 DIAGNOSIS — R2689 Other abnormalities of gait and mobility: Secondary | ICD-10-CM | POA: Diagnosis not present

## 2022-12-13 DIAGNOSIS — M25561 Pain in right knee: Secondary | ICD-10-CM

## 2022-12-13 DIAGNOSIS — R29898 Other symptoms and signs involving the musculoskeletal system: Secondary | ICD-10-CM

## 2022-12-13 DIAGNOSIS — M1711 Unilateral primary osteoarthritis, right knee: Secondary | ICD-10-CM | POA: Diagnosis not present

## 2022-12-13 NOTE — Therapy (Signed)
OUTPATIENT PHYSICAL THERAPY LOWER EXTREMITY Treatment   Patient Name: Christie Beasley MRN: [id number] DOB:[birth year], 72 y.o., female Today's Date: 12/13/2022  END OF SESSION:  PT End of Session - 12/13/22 1304     Visit Number 3    Number of Visits 16    Date for PT Re-Evaluation 01/11/23    Authorization Type [employer]    PT Start Time 1300    PT Stop Time 1340    PT Time Calculation (min) 40 min    Activity Tolerance Patient tolerated treatment well    Behavior During Therapy WFL for tasks assessed/performed              Past Medical History:  Diagnosis Date   Acid reflux    Arthritis    Elevated lipids    Fibroid    History of fainting    pt reports she is a "fainter".     PONV (postoperative nausea and vomiting)    Rosacea    Thrombophlebitis 11/2000   Thyroid nodule 09/2002   Past Surgical History:  Procedure Laterality Date   BREAST EXCISIONAL BIOPSY Left    BREAST EXCISIONAL BIOPSY Right    CESAREAN SECTION     x 2   DILATION AND CURETTAGE OF UTERUS  1994   FOOT SURGERY Bilateral    hammer toes   HYSTEROSCOPY  01/11/2012   D&C   KNEE SURGERY Left 2012   MEDIAL PARTIAL KNEE REPLACEMENT Left 03/10/2015   THYROID LOBECTOMY Right 02/2003   TONSILLECTOMY AND ADENOIDECTOMY     TOTAL KNEE ARTHROPLASTY Right 11/27/2022   Procedure: RIGHT TOTAL KNEE ARTHROPLASTY;  Surgeon: Durene Romans, MD;  Location: [facility];  Service: Orthopedics;  Laterality: Right;   Patient Active Problem List   Diagnosis Date Noted   S/P total knee arthroplasty, right 11/27/2022   Educated about COVID-19 virus infection 07/07/2019   Palpitations 07/07/2019   Pain in right knee 07/11/2017   History of prosthetic unicompartmental arthroplasty of left knee 03/10/2015   Post-menopausal bleeding 11/12/2012   Vaginal atrophy 11/12/2012    PCP: Garth Bigness MD  REFERRING PROVIDER: Durene Romans, MD  REFERRING DIAG: M17.11 (ICD-10-CM) - Unilateral primary  osteoarthritis, right knee; S/P right total knee arthroplasty 11/27/22  THERAPY DIAG:  Right knee pain, unspecified chronicity  Stiffness of right knee, not elsewhere classified  Muscle weakness (generalized)  Other abnormalities of gait and mobility  Other symptoms and signs involving the musculoskeletal system  Rationale for Evaluation and Treatment: Rehabilitation  ONSET DATE: DOS 11/27/22  SUBJECTIVE:   SUBJECTIVE STATEMENT: The patient states knee is doing good, been doing HEP.   PERTINENT HISTORY: R TKA PAIN:  Are you having pain? Yes: NPRS scale: 4-5/10 Pain location: R knee Pain description: achy, stabbing Aggravating factors: bending, walking Relieving factors: meds  PRECAUTIONS: None  WEIGHT BEARING RESTRICTIONS: No  FALLS:  Has patient fallen in last 6 months? No   PLOF: Independent  PATIENT GOALS: walk normally, stairs with good mechanics   OBJECTIVE: (objective measures from initial evaluation unless otherwise dated)  PATIENT SURVEYS:  FOTO 43% function  COGNITION: Overall cognitive status: Within functional limits for tasks assessed     SENSATION: WFL    POSTURE: No Significant postural limitations  PALPATION: TTP grossly in R knee, mostly hamstrings  LOWER EXTREMITY ROM:  Active ROM Right eval Left eval  12/13/22  Hip flexion      Hip extension      Hip abduction  Hip adduction      Hip internal rotation      Hip external rotation      Knee flexion 93 121 106 106  Knee extension Lacking 4 0  Lacking 5  Ankle dorsiflexion      Ankle plantarflexion      Ankle inversion      Ankle eversion       (Blank rows = not tested) *= pain/symptoms  LOWER EXTREMITY MMT:  MMT Right eval Left eval  Hip flexion    Hip extension    Hip abduction    Hip adduction    Hip internal rotation    Hip external rotation    Knee flexion 4 5  Knee extension 3- 5  Ankle dorsiflexion    Ankle plantarflexion    Ankle inversion    Ankle  eversion     (Blank rows = not tested) *= pain/symptoms    FUNCTIONAL TESTS:  5 times sit to stand: 32.09 seconds with UE use, relies LLE  GAIT: Distance walked: 100 feet Assistive device utilized: Environmental consultant - 2 wheeled Level of assistance: Modified independence Comments: RW, antalgic on R   TODAY'S TREATMENT:                                                                                                                              DATE:  12/13/22 Recumbent bike 5 minutes rocking for dynamic warm up  Heel slides 5 x 10 second holds SLR 3x10  SAQ 2x10 with 5 second holds TKE GTB 10 x 10 second holds HR 1 x 20 TR 1 x 20 Mini squat 2 x 10 Standing hip abduction 2 x 10 bilateral    10/17 Quad sets x15 SLR 2x10  SAQ 2x10   Heel raise 2x10  Standing slow march 2x10      11/30/22 Ankle pumps 1 x 10 Quad sets 10 x 10 second holds Heel slides 10 x 10 second holds HS isometric 10 x 10 second holds SAQ 10x 5 second holds Heel prop and elevation    PATIENT EDUCATION:  Education details: Patient educated on exam findings, POC, scope of PT, HEP, and knee positioning at rest, elevation. Person educated: Patient Education method: Explanation, Demonstration, and Handouts Education comprehension: verbalized understanding, returned demonstration, verbal cues required, and tactile cues required  HOME EXERCISE PROGRAM: Access Code: [email or web address] URL: [email or web address] Date: 11/30/2022 - Seated Ankle Pumps on Table  - 5 x daily - 7 x weekly - 1 sets - 20 reps - Supine Quadricep Sets  - 5 x daily - 7 x weekly - 10 reps - 10 second hold - Supine Heel Slide with Strap (Mirrored)  - 5 x daily - 7 x weekly - 10 reps - 10 second hold - Supine Isometric Hamstring Set  - 5 x daily - 7 x weekly - 10 reps - 10 second hold - Supine Knee Extension Strengthening (Mirrored)  - 5 x daily -  7 x weekly - 10 reps - 5 second hold  12/13/22 - Standing Terminal Knee Extension with  Resistance  - 1 x daily - 7 x weekly - 2 sets - 10 reps - 10 second hold - Standing Hip Abduction with Counter Support  - 1 x daily - 7 x weekly - 2 sets - 10 reps - Mini Squat with Counter Support  - 1 x daily - 7 x weekly - 2 sets - 10 reps  ASSESSMENT:  CLINICAL IMPRESSION: Began session on bike for dynamic warm up and conditioning. Patient with ROM from lacking 5 to 106. No change with heel slides. Slight quad lag with SLR. Moderate fatigue at end of session. Patient will continue to benefit from physical therapy in order to improve function and reduce impairment.      Eval:Patient a 72 y.o. y.o. female who was seen today for physical therapy evaluation and treatment for S/P right total knee arthroplasty 11/27/22. Patient presents with pain limited deficits in R knee strength, ROM, endurance, activity tolerance, gait, balance, and functional mobility with ADL. Patient is having to modify and restrict ADL as indicated by outcome measure score as well as subjective information and objective measures which is affecting overall participation. Patient will benefit from skilled physical therapy in order to improve function and reduce impairment.  OBJECTIVE IMPAIRMENTS: Abnormal gait, decreased activity tolerance, decreased balance, decreased endurance, decreased mobility, difficulty walking, decreased ROM, decreased strength, improper body mechanics, and pain.   ACTIVITY LIMITATIONS: carrying, lifting, bending, standing, squatting, sleeping, stairs, transfers, toileting, dressing, hygiene/grooming, locomotion level, and caring for others  PARTICIPATION LIMITATIONS: meal prep, cleaning, laundry, driving, shopping, community activity, and yard work  PERSONAL FACTORS: Time since onset of injury/illness/exacerbation are also affecting patient's functional outcome.   REHAB POTENTIAL: Good  CLINICAL DECISION MAKING: Stable/uncomplicated  EVALUATION COMPLEXITY: Low   GOALS: Goals reviewed with  patient? Yes  SHORT TERM GOALS: Target date: 12/28/2022    Patient will be independent with HEP in order to improve functional outcomes. Baseline: Goal status: INITIAL  2.  Patient will report at least 25% improvement in symptoms for improved quality of life. Baseline: Goal status: INITIAL    LONG TERM GOALS: Target date: 01/25/2023    Patient will report at least 75% improvement in symptoms for improved quality of life. Baseline:  Goal status: INITIAL  2.  Patient will improve FOTO score by at least 32 points in order to indicate improved tolerance to activity. Baseline: 43% function Goal status: INITIAL  3.  Patient will be able to navigate stairs with reciprocal pattern without compensation in order to demonstrate improved LE strength. Baseline:  Goal status: INITIAL  4.  Patient will improve ROM for R knee extension/flexion to 0-120 degrees to improve squatting, and other functional mobility. Baseline: lacking 4 to 93 Goal status: INITIAL  5.  Patient will be able to complete 5x STS in under 11.4 seconds in order to reduce the risk of falls. Baseline:  Goal status: INITIAL  6.  Patient will demonstrate grade of 5/5 MMT grade in all tested musculature as evidence of improved strength to assist with stair ambulation and gait.   Baseline:  Goal status: INITIAL   PLAN:  PT FREQUENCY: 1-2x/week  PT DURATION: 8 weeks  PLANNED INTERVENTIONS: Therapeutic exercises, Therapeutic activity, Neuromuscular re-education, Balance training, Gait training, Patient/Family education, Joint manipulation, Joint mobilization, Stair training, Orthotic/Fit training, DME instructions, Aquatic Therapy, Dry Needling, Electrical stimulation, Spinal manipulation, Spinal mobilization, Cryotherapy, Moist heat, Compression bandaging,  scar mobilization, Splintting, Taping, Traction, Ultrasound, Ionotophoresis 4mg /ml Dexamethasone, and Manual therapy  PLAN FOR NEXT SESSION: Continue L knee mobility  and strength and progress as able   Wyman Songster, PT 12/13/2022, 1:05 PM   Referring diagnosis? M17.11 (ICD-10-CM) - Unilateral primary osteoarthritis, right knee Treatment diagnosis? (if different than referring diagnosis) M25.561 What was this (referring dx) caused by? [x]  Surgery []  Fall []  Ongoing issue []  Arthritis []  Other: ____________  Laterality: [x]  Rt []  Lt []  Both  Check all possible CPT codes:  *CHOOSE 10 OR LESS*    [x]  97110 (Therapeutic Exercise)  []  92507 (SLP Treatment)  [x]  16109 (Neuro Re-ed)   []  92526 (Swallowing Treatment)   [x]  97116 (Gait Training)   []  [id number] (Cognitive Training, 1st 15 minutes) [x]  97140 (Manual Therapy)   []  97130 (Cognitive Training, each add'l 15 minutes)  [x]  97164 (Re-evaluation)                              []  Other, List CPT Code ____________  [x]  97530 (Therapeutic Activities)     [x]  97535 (Self Care)   [x]  All codes above (97110 - 97535)  []  97012 (Mechanical Traction)  []  97014 (E-stim Unattended)  []  97032 (E-stim manual)  []  97033 (Ionto)  []  97035 (Ultrasound) []  97750 (Physical Performance Training) [x]  [id number] (Aquatic Therapy) []  97016 (Vasopneumatic Device) []  [id number] (Paraffin) []  97034 (Contrast Bath) []  97597 (Wound Care 1st 20 sq cm) []  97598 (Wound Care each add'l 20 sq cm) []  97760 (Orthotic Fabrication, Fitting, Training Initial) []  [id number] (Prosthetic Management and Training Initial) []  [id number] (Orthotic or Prosthetic Training/ Modification Subsequent)

## 2022-12-20 ENCOUNTER — Encounter (HOSPITAL_BASED_OUTPATIENT_CLINIC_OR_DEPARTMENT_OTHER): Payer: Self-pay | Admitting: Physical Therapy

## 2022-12-20 ENCOUNTER — Ambulatory Visit (HOSPITAL_BASED_OUTPATIENT_CLINIC_OR_DEPARTMENT_OTHER): Payer: Medicare PPO | Admitting: Physical Therapy

## 2022-12-20 DIAGNOSIS — M25561 Pain in right knee: Secondary | ICD-10-CM | POA: Diagnosis not present

## 2022-12-20 DIAGNOSIS — M25661 Stiffness of right knee, not elsewhere classified: Secondary | ICD-10-CM | POA: Diagnosis not present

## 2022-12-20 DIAGNOSIS — M6281 Muscle weakness (generalized): Secondary | ICD-10-CM | POA: Diagnosis not present

## 2022-12-20 DIAGNOSIS — R2689 Other abnormalities of gait and mobility: Secondary | ICD-10-CM

## 2022-12-20 DIAGNOSIS — M1711 Unilateral primary osteoarthritis, right knee: Secondary | ICD-10-CM | POA: Diagnosis not present

## 2022-12-20 DIAGNOSIS — R29898 Other symptoms and signs involving the musculoskeletal system: Secondary | ICD-10-CM | POA: Diagnosis not present

## 2022-12-20 NOTE — Therapy (Signed)
OUTPATIENT PHYSICAL THERAPY LOWER EXTREMITY Treatment   Patient Name: Christie Beasley MRN: [id number] DOB:06/30/1950, 72 y.o., female Today's Date: 12/20/2022  END OF SESSION:  PT End of Session - 12/20/22 1308     Visit Number 4    Number of Visits 16    Date for PT Re-Evaluation 01/11/23    Authorization Type [employer]    PT Start Time 1306    PT Stop Time 1345    PT Time Calculation (min) 39 min    Activity Tolerance Patient tolerated treatment well    Behavior During Therapy WFL for tasks assessed/performed              Past Medical History:  Diagnosis Date   Acid reflux    Arthritis    Elevated lipids    Fibroid    History of fainting    pt reports she is a "fainter".     PONV (postoperative nausea and vomiting)    Rosacea    Thrombophlebitis 11/2000   Thyroid nodule 09/2002   Past Surgical History:  Procedure Laterality Date   BREAST EXCISIONAL BIOPSY Left    BREAST EXCISIONAL BIOPSY Right    CESAREAN SECTION     x 2   DILATION AND CURETTAGE OF UTERUS  1994   FOOT SURGERY Bilateral    hammer toes   HYSTEROSCOPY  01/11/2012   D&C   KNEE SURGERY Left 2012   MEDIAL PARTIAL KNEE REPLACEMENT Left 03/10/2015   THYROID LOBECTOMY Right 02/2003   TONSILLECTOMY AND ADENOIDECTOMY     TOTAL KNEE ARTHROPLASTY Right 11/27/2022   Procedure: RIGHT TOTAL KNEE ARTHROPLASTY;  Surgeon: Durene Romans, MD;  Location: [facility];  Service: Orthopedics;  Laterality: Right;   Patient Active Problem List   Diagnosis Date Noted   S/P total knee arthroplasty, right 11/27/2022   Educated about COVID-19 virus infection 07/07/2019   Palpitations 07/07/2019   Pain in right knee 07/11/2017   History of prosthetic unicompartmental arthroplasty of left knee 03/10/2015   Post-menopausal bleeding 11/12/2012   Vaginal atrophy 11/12/2012    PCP: Garth Bigness MD  REFERRING PROVIDER: Durene Romans, MD  REFERRING DIAG: M17.11 (ICD-10-CM) - Unilateral primary  osteoarthritis, right knee; S/P right total knee arthroplasty 11/27/22  THERAPY DIAG:  Right knee pain, unspecified chronicity  Stiffness of right knee, not elsewhere classified  Muscle weakness (generalized)  Other abnormalities of gait and mobility  Other symptoms and signs involving the musculoskeletal system  Rationale for Evaluation and Treatment: Rehabilitation  ONSET DATE: DOS 11/27/22  SUBJECTIVE:   SUBJECTIVE STATEMENT: The patient states sore after exercise.   PERTINENT HISTORY: R TKA PAIN:  Are you having pain? Yes: NPRS scale: 3/10 Pain location: R knee Pain description: achy, stabbing Aggravating factors: bending, walking Relieving factors: meds  PRECAUTIONS: None  WEIGHT BEARING RESTRICTIONS: No  FALLS:  Has patient fallen in last 6 months? No   PLOF: Independent  PATIENT GOALS: walk normally, stairs with good mechanics   OBJECTIVE: (objective measures from initial evaluation unless otherwise dated)  PATIENT SURVEYS:  FOTO 43% function  COGNITION: Overall cognitive status: Within functional limits for tasks assessed     SENSATION: WFL    POSTURE: No Significant postural limitations  PALPATION: TTP grossly in R knee, mostly hamstrings  LOWER EXTREMITY ROM:  Active ROM Right eval Left eval  12/13/22 12/20/22  Hip flexion       Hip extension       Hip abduction  Hip adduction       Hip internal rotation       Hip external rotation       Knee flexion 93 121 106 106 112  Knee extension Lacking 4 0  Lacking 5 Lacking 9  Ankle dorsiflexion       Ankle plantarflexion       Ankle inversion       Ankle eversion        (Blank rows = not tested) *= pain/symptoms  LOWER EXTREMITY MMT:  MMT Right eval Left eval  Hip flexion    Hip extension    Hip abduction    Hip adduction    Hip internal rotation    Hip external rotation    Knee flexion 4 5  Knee extension 3- 5  Ankle dorsiflexion    Ankle plantarflexion    Ankle  inversion    Ankle eversion     (Blank rows = not tested) *= pain/symptoms    FUNCTIONAL TESTS:  5 times sit to stand: 32.09 seconds with UE use, relies LLE  GAIT: Distance walked: 100 feet Assistive device utilized: Environmental consultant - 2 wheeled Level of assistance: Modified independence Comments: RW, antalgic on R   TODAY'S TREATMENT:                                                                                                                              DATE:  12/20/22 Recumbent bike 5 minutes rocking for dynamic warm up  Quad set 5 x 10 SLR 1x10  TKE GTB 10 x 10 second holds Retrograde edema massage  10 minutes Mini squat 1 x 10 LAQ 10 x 5 second holds  12/13/22 Recumbent bike 5 minutes rocking for dynamic warm up  Heel slides 5 x 10 second holds SLR 3x10  SAQ 2x10 with 5 second holds TKE GTB 10 x 10 second holds HR 1 x 20 TR 1 x 20 Mini squat 2 x 10 Standing hip abduction 2 x 10 bilateral    10/17 Quad sets x15 SLR 2x10  SAQ 2x10   Heel raise 2x10  Standing slow march 2x10      11/30/22 Ankle pumps 1 x 10 Quad sets 10 x 10 second holds Heel slides 10 x 10 second holds HS isometric 10 x 10 second holds SAQ 10x 5 second holds Heel prop and elevation    PATIENT EDUCATION:  Education details: Patient educated on exam findings, POC, scope of PT, HEP, and knee positioning at rest, elevation. Person educated: Patient Education method: Explanation, Demonstration, and Handouts Education comprehension: verbalized understanding, returned demonstration, verbal cues required, and tactile cues required  HOME EXERCISE PROGRAM: Access Code: [email or web address] URL: [email or web address] Date: 11/30/2022 - Seated Ankle Pumps on Table  - 5 x daily - 7 x weekly - 1 sets - 20 reps - Supine Quadricep Sets  - 5 x daily - 7 x weekly - 10 reps - 10 second hold - Supine  Heel Slide with Strap (Mirrored)  - 5 x daily - 7 x weekly - 10 reps - 10 second hold - Supine  Isometric Hamstring Set  - 5 x daily - 7 x weekly - 10 reps - 10 second hold - Supine Knee Extension Strengthening (Mirrored)  - 5 x daily - 7 x weekly - 10 reps - 5 second hold  12/13/22 - Standing Terminal Knee Extension with Resistance  - 1 x daily - 7 x weekly - 2 sets - 10 reps - 10 second hold - Standing Hip Abduction with Counter Support  - 1 x daily - 7 x weekly - 2 sets - 10 reps - Mini Squat with Counter Support  - 1 x daily - 7 x weekly - 2 sets - 10 reps  12/20/22- Seated Long Arc Quad  - 1 x daily - 7 x weekly - 1-2 sets - 10 reps  ASSESSMENT:  CLINICAL IMPRESSION: Began session on bike for dynamic warm up and conditioning. Patient with ROM from lacking 9 to 112. Decreased extension but improved flexion from last session. Slight quad lag with SLR. Patient with R knee edema that improves some with retrograde massage. Slight improvement in extension to about 8 following. Educated on LE positioning at home.  Patient will continue to benefit from physical therapy in order to improve function and reduce impairment.      Eval:Patient a 72 y.o. y.o. female who was seen today for physical therapy evaluation and treatment for S/P right total knee arthroplasty 11/27/22. Patient presents with pain limited deficits in R knee strength, ROM, endurance, activity tolerance, gait, balance, and functional mobility with ADL. Patient is having to modify and restrict ADL as indicated by outcome measure score as well as subjective information and objective measures which is affecting overall participation. Patient will benefit from skilled physical therapy in order to improve function and reduce impairment.  OBJECTIVE IMPAIRMENTS: Abnormal gait, decreased activity tolerance, decreased balance, decreased endurance, decreased mobility, difficulty walking, decreased ROM, decreased strength, improper body mechanics, and pain.   ACTIVITY LIMITATIONS: carrying, lifting, bending, standing, squatting, sleeping,  stairs, transfers, toileting, dressing, hygiene/grooming, locomotion level, and caring for others  PARTICIPATION LIMITATIONS: meal prep, cleaning, laundry, driving, shopping, community activity, and yard work  PERSONAL FACTORS: Time since onset of injury/illness/exacerbation are also affecting patient's functional outcome.   REHAB POTENTIAL: Good  CLINICAL DECISION MAKING: Stable/uncomplicated  EVALUATION COMPLEXITY: Low   GOALS: Goals reviewed with patient? Yes  SHORT TERM GOALS: Target date: 12/28/2022    Patient will be independent with HEP in order to improve functional outcomes. Baseline: Goal status: INITIAL  2.  Patient will report at least 25% improvement in symptoms for improved quality of life. Baseline: Goal status: INITIAL    LONG TERM GOALS: Target date: 01/25/2023    Patient will report at least 75% improvement in symptoms for improved quality of life. Baseline:  Goal status: INITIAL  2.  Patient will improve FOTO score by at least 32 points in order to indicate improved tolerance to activity. Baseline: 43% function Goal status: INITIAL  3.  Patient will be able to navigate stairs with reciprocal pattern without compensation in order to demonstrate improved LE strength. Baseline:  Goal status: INITIAL  4.  Patient will improve ROM for R knee extension/flexion to 0-120 degrees to improve squatting, and other functional mobility. Baseline: lacking 4 to 93 Goal status: INITIAL  5.  Patient will be able to complete 5x STS in under 11.4 seconds in  order to reduce the risk of falls. Baseline:  Goal status: INITIAL  6.  Patient will demonstrate grade of 5/5 MMT grade in all tested musculature as evidence of improved strength to assist with stair ambulation and gait.   Baseline:  Goal status: INITIAL   PLAN:  PT FREQUENCY: 1-2x/week  PT DURATION: 8 weeks  PLANNED INTERVENTIONS: Therapeutic exercises, Therapeutic activity, Neuromuscular re-education,  Balance training, Gait training, Patient/Family education, Joint manipulation, Joint mobilization, Stair training, Orthotic/Fit training, DME instructions, Aquatic Therapy, Dry Needling, Electrical stimulation, Spinal manipulation, Spinal mobilization, Cryotherapy, Moist heat, Compression bandaging, scar mobilization, Splintting, Taping, Traction, Ultrasound, Ionotophoresis 4mg /ml Dexamethasone, and Manual therapy  PLAN FOR NEXT SESSION: Continue L knee mobility and strength and progress as able   Wyman Songster, PT 12/20/2022, 1:50 PM   Referring diagnosis? M17.11 (ICD-10-CM) - Unilateral primary osteoarthritis, right knee Treatment diagnosis? (if different than referring diagnosis) M25.561 What was this (referring dx) caused by? [x]  Surgery []  Fall []  Ongoing issue []  Arthritis []  Other: ____________  Laterality: [x]  Rt []  Lt []  Both  Check all possible CPT codes:  *CHOOSE 10 OR LESS*    [x]  97110 (Therapeutic Exercise)  []  41324 (SLP Treatment)  [x]  97112 (Neuro Re-ed)   []  92526 (Swallowing Treatment)   [x]  97116 (Gait Training)   []  [id number] (Cognitive Training, 1st 15 minutes) [x]  97140 (Manual Therapy)   []  97130 (Cognitive Training, each add'l 15 minutes)  [x]  97164 (Re-evaluation)                              []  Other, List CPT Code ____________  [x]  97530 (Therapeutic Activities)     [x]  97535 (Self Care)   [x]  All codes above (97110 - 97535)  []  97012 (Mechanical Traction)  []  97014 (E-stim Unattended)  []  97032 (E-stim manual)  []  97033 (Ionto)  []  97035 (Ultrasound) []  97750 (Physical Performance Training) [x]  [id number] (Aquatic Therapy) []  97016 (Vasopneumatic Device) []  [id number] (Paraffin) []  97034 (Contrast Bath) []  97597 (Wound Care 1st 20 sq cm) []  97598 (Wound Care each add'l 20 sq cm) []  97760 (Orthotic Fabrication, Fitting, Training Initial) []  [id number] (Prosthetic Management and Training Initial) []  [id number] (Orthotic or Prosthetic Training/ Modification  Subsequent)

## 2022-12-26 ENCOUNTER — Encounter (HOSPITAL_BASED_OUTPATIENT_CLINIC_OR_DEPARTMENT_OTHER): Payer: Self-pay | Admitting: Physical Therapy

## 2022-12-26 ENCOUNTER — Ambulatory Visit (HOSPITAL_BASED_OUTPATIENT_CLINIC_OR_DEPARTMENT_OTHER): Payer: Medicare PPO | Attending: Family Medicine | Admitting: Physical Therapy

## 2022-12-26 DIAGNOSIS — R2689 Other abnormalities of gait and mobility: Secondary | ICD-10-CM | POA: Insufficient documentation

## 2022-12-26 DIAGNOSIS — R29898 Other symptoms and signs involving the musculoskeletal system: Secondary | ICD-10-CM | POA: Diagnosis not present

## 2022-12-26 DIAGNOSIS — M25561 Pain in right knee: Secondary | ICD-10-CM | POA: Insufficient documentation

## 2022-12-26 DIAGNOSIS — M25661 Stiffness of right knee, not elsewhere classified: Secondary | ICD-10-CM | POA: Diagnosis not present

## 2022-12-26 DIAGNOSIS — M6281 Muscle weakness (generalized): Secondary | ICD-10-CM | POA: Diagnosis not present

## 2022-12-26 NOTE — Therapy (Signed)
OUTPATIENT PHYSICAL THERAPY LOWER EXTREMITY Treatment   Patient Name: Christie Beasley MRN: [id number] DOB:March 22, 1950, 72 y.o., female Today's Date: 12/26/2022  END OF SESSION:  PT End of Session - 12/26/22 1254     Visit Number 5    Number of Visits 16    Date for PT Re-Evaluation 01/11/23    Authorization Type [employer]    PT Start Time 1300    PT Stop Time 1340    PT Time Calculation (min) 40 min    Activity Tolerance Patient tolerated treatment well    Behavior During Therapy WFL for tasks assessed/performed              Past Medical History:  Diagnosis Date   Acid reflux    Arthritis    Elevated lipids    Fibroid    History of fainting    pt reports she is a "fainter".     PONV (postoperative nausea and vomiting)    Rosacea    Thrombophlebitis 11/2000   Thyroid nodule 09/2002   Past Surgical History:  Procedure Laterality Date   BREAST EXCISIONAL BIOPSY Left    BREAST EXCISIONAL BIOPSY Right    CESAREAN SECTION     x 2   DILATION AND CURETTAGE OF UTERUS  1994   FOOT SURGERY Bilateral    hammer toes   HYSTEROSCOPY  01/11/2012   D&C   KNEE SURGERY Left 2012   MEDIAL PARTIAL KNEE REPLACEMENT Left 03/10/2015   THYROID LOBECTOMY Right 02/2003   TONSILLECTOMY AND ADENOIDECTOMY     TOTAL KNEE ARTHROPLASTY Right 11/27/2022   Procedure: RIGHT TOTAL KNEE ARTHROPLASTY;  Surgeon: Durene Romans, MD;  Location: [facility];  Service: Orthopedics;  Laterality: Right;   Patient Active Problem List   Diagnosis Date Noted   S/P total knee arthroplasty, right 11/27/2022   Educated about COVID-19 virus infection 07/07/2019   Palpitations 07/07/2019   Pain in right knee 07/11/2017   History of prosthetic unicompartmental arthroplasty of left knee 03/10/2015   Post-menopausal bleeding 11/12/2012   Vaginal atrophy 11/12/2012    PCP: Garth Bigness MD  REFERRING PROVIDER: Durene Romans, MD  REFERRING DIAG: M17.11 (ICD-10-CM) - Unilateral primary  osteoarthritis, right knee; S/P right total knee arthroplasty 11/27/22  THERAPY DIAG:  Right knee pain, unspecified chronicity  Stiffness of right knee, not elsewhere classified  Muscle weakness (generalized)  Other abnormalities of gait and mobility  Other symptoms and signs involving the musculoskeletal system  Rationale for Evaluation and Treatment: Rehabilitation  ONSET DATE: DOS 11/27/22  SUBJECTIVE:   SUBJECTIVE STATEMENT: The patient states she has been walking without cane. HEP going well. Sleeping hasn't been the best.   PERTINENT HISTORY: R TKA PAIN:  Are you having pain? Yes: NPRS scale: 3/10 Pain location: R knee Pain description: achy, stabbing Aggravating factors: bending, walking Relieving factors: meds  PRECAUTIONS: None  WEIGHT BEARING RESTRICTIONS: No  FALLS:  Has patient fallen in last 6 months? No   PLOF: Independent  PATIENT GOALS: walk normally, stairs with good mechanics   OBJECTIVE: (objective measures from initial evaluation unless otherwise dated)  PATIENT SURVEYS:  FOTO 43% function  COGNITION: Overall cognitive status: Within functional limits for tasks assessed     SENSATION: WFL    POSTURE: No Significant postural limitations  PALPATION: TTP grossly in R knee, mostly hamstrings  LOWER EXTREMITY ROM:  Active ROM Right eval Left eval  12/13/22 12/20/22 12/26/22  Hip flexion        Hip extension  Hip abduction        Hip adduction        Hip internal rotation        Hip external rotation        Knee flexion 93 121 106 106 112 116  Knee extension Lacking 4 0  Lacking 5 Lacking 9 Lacking 7  Ankle dorsiflexion        Ankle plantarflexion        Ankle inversion        Ankle eversion         (Blank rows = not tested) *= pain/symptoms  LOWER EXTREMITY MMT:  MMT Right eval Left eval  Hip flexion    Hip extension    Hip abduction    Hip adduction    Hip internal rotation    Hip external rotation    Knee  flexion 4 5  Knee extension 3- 5  Ankle dorsiflexion    Ankle plantarflexion    Ankle inversion    Ankle eversion     (Blank rows = not tested) *= pain/symptoms    FUNCTIONAL TESTS:  5 times sit to stand: 32.09 seconds with UE use, relies LLE  GAIT: Distance walked: 100 feet Assistive device utilized: Environmental consultant - 2 wheeled Level of assistance: Modified independence Comments: RW, antalgic on R   TODAY'S TREATMENT:                                                                                                                              DATE:  12/26/22 Recumbent bike 5 minutes rocking for dynamic warm up  SLR 1x10 Prone knee hang 3 minutes Prone quad set 10 x 10 second holds Manual: extension ROM with overpressure, patellar mobilizations Supine active hamstring stretch 10 x 10 second holds Contract relax into extensions 10 x 5 second holds Single leg stance 3 way reach 5 x 5 second holds     12/20/22 Recumbent bike 5 minutes rocking for dynamic warm up  Quad set 5 x 10 SLR 1x10  TKE GTB 10 x 10 second holds Retrograde edema massage  10 minutes Mini squat 1 x 10 LAQ 10 x 5 second holds  12/13/22 Recumbent bike 5 minutes rocking for dynamic warm up  Heel slides 5 x 10 second holds SLR 3x10  SAQ 2x10 with 5 second holds TKE GTB 10 x 10 second holds HR 1 x 20 TR 1 x 20 Mini squat 2 x 10 Standing hip abduction 2 x 10 bilateral    10/17 Quad sets x15 SLR 2x10  SAQ 2x10   Heel raise 2x10  Standing slow march 2x10      11/30/22 Ankle pumps 1 x 10 Quad sets 10 x 10 second holds Heel slides 10 x 10 second holds HS isometric 10 x 10 second holds SAQ 10x 5 second holds Heel prop and elevation    PATIENT EDUCATION:  Education details: Patient educated on exam findings, POC, scope  of PT, HEP, and knee positioning at rest, elevation. Person educated: Patient Education method: Explanation, Demonstration, and Handouts Education comprehension: verbalized  understanding, returned demonstration, verbal cues required, and tactile cues required  HOME EXERCISE PROGRAM: Access Code: [email or web address] URL: [email or web address] Date: 11/30/2022 - Seated Ankle Pumps on Table  - 5 x daily - 7 x weekly - 1 sets - 20 reps - Supine Quadricep Sets  - 5 x daily - 7 x weekly - 10 reps - 10 second hold - Supine Heel Slide with Strap (Mirrored)  - 5 x daily - 7 x weekly - 10 reps - 10 second hold - Supine Isometric Hamstring Set  - 5 x daily - 7 x weekly - 10 reps - 10 second hold - Supine Knee Extension Strengthening (Mirrored)  - 5 x daily - 7 x weekly - 10 reps - 5 second hold  12/13/22 - Standing Terminal Knee Extension with Resistance  - 1 x daily - 7 x weekly - 2 sets - 10 reps - 10 second hold - Standing Hip Abduction with Counter Support  - 1 x daily - 7 x weekly - 2 sets - 10 reps - Mini Squat with Counter Support  - 1 x daily - 7 x weekly - 2 sets - 10 reps  12/20/22- Seated Long Arc Quad  - 1 x daily - 7 x weekly - 1-2 sets - 10 reps  ASSESSMENT:  CLINICAL IMPRESSION: Began session on bike for dynamic warm up and conditioning. Patient with ROM from lacking 7 to 116. Improving flexion and extension. Lacking TKE with SLR but no lag. Improved to lacking 6 following manual. Educated on positioning of knee at home. Some swelling also present likely contributing to TKE limitation. Patient will continue to benefit from physical therapy in order to improve function and reduce impairment.      Eval:Patient a 72 y.o. y.o. female who was seen today for physical therapy evaluation and treatment for S/P right total knee arthroplasty 11/27/22. Patient presents with pain limited deficits in R knee strength, ROM, endurance, activity tolerance, gait, balance, and functional mobility with ADL. Patient is having to modify and restrict ADL as indicated by outcome measure score as well as subjective information and objective measures which is affecting overall  participation. Patient will benefit from skilled physical therapy in order to improve function and reduce impairment.  OBJECTIVE IMPAIRMENTS: Abnormal gait, decreased activity tolerance, decreased balance, decreased endurance, decreased mobility, difficulty walking, decreased ROM, decreased strength, improper body mechanics, and pain.   ACTIVITY LIMITATIONS: carrying, lifting, bending, standing, squatting, sleeping, stairs, transfers, toileting, dressing, hygiene/grooming, locomotion level, and caring for others  PARTICIPATION LIMITATIONS: meal prep, cleaning, laundry, driving, shopping, community activity, and yard work  PERSONAL FACTORS: Time since onset of injury/illness/exacerbation are also affecting patient's functional outcome.   REHAB POTENTIAL: Good  CLINICAL DECISION MAKING: Stable/uncomplicated  EVALUATION COMPLEXITY: Low   GOALS: Goals reviewed with patient? Yes  SHORT TERM GOALS: Target date: 12/28/2022    Patient will be independent with HEP in order to improve functional outcomes. Baseline: Goal status: INITIAL  2.  Patient will report at least 25% improvement in symptoms for improved quality of life. Baseline: Goal status: INITIAL    LONG TERM GOALS: Target date: 01/25/2023    Patient will report at least 75% improvement in symptoms for improved quality of life. Baseline:  Goal status: INITIAL  2.  Patient will improve FOTO score by at least 32 points in order to indicate improved tolerance to  activity. Baseline: 43% function Goal status: INITIAL  3.  Patient will be able to navigate stairs with reciprocal pattern without compensation in order to demonstrate improved LE strength. Baseline:  Goal status: INITIAL  4.  Patient will improve ROM for R knee extension/flexion to 0-120 degrees to improve squatting, and other functional mobility. Baseline: lacking 4 to 93 Goal status: INITIAL  5.  Patient will be able to complete 5x STS in under 11.4 seconds  in order to reduce the risk of falls. Baseline:  Goal status: INITIAL  6.  Patient will demonstrate grade of 5/5 MMT grade in all tested musculature as evidence of improved strength to assist with stair ambulation and gait.   Baseline:  Goal status: INITIAL   PLAN:  PT FREQUENCY: 1-2x/week  PT DURATION: 8 weeks  PLANNED INTERVENTIONS: Therapeutic exercises, Therapeutic activity, Neuromuscular re-education, Balance training, Gait training, Patient/Family education, Joint manipulation, Joint mobilization, Stair training, Orthotic/Fit training, DME instructions, Aquatic Therapy, Dry Needling, Electrical stimulation, Spinal manipulation, Spinal mobilization, Cryotherapy, Moist heat, Compression bandaging, scar mobilization, Splintting, Taping, Traction, Ultrasound, Ionotophoresis 4mg /ml Dexamethasone, and Manual therapy  PLAN FOR NEXT SESSION: Continue L knee mobility and strength and progress as able   Wyman Songster, PT 12/26/2022, 12:54 PM   Referring diagnosis? M17.11 (ICD-10-CM) - Unilateral primary osteoarthritis, right knee Treatment diagnosis? (if different than referring diagnosis) M25.561 What was this (referring dx) caused by? [x]  Surgery []  Fall []  Ongoing issue []  Arthritis []  Other: ____________  Laterality: [x]  Rt []  Lt []  Both  Check all possible CPT codes:  *CHOOSE 10 OR LESS*    [x]  97110 (Therapeutic Exercise)  []  92507 (SLP Treatment)  [x]  97112 (Neuro Re-ed)   []  92526 (Swallowing Treatment)   [x]  97116 (Gait Training)   []  [id number] (Cognitive Training, 1st 15 minutes) [x]  97140 (Manual Therapy)   []  97130 (Cognitive Training, each add'l 15 minutes)  [x]  97164 (Re-evaluation)                              []  Other, List CPT Code ____________  [x]  97530 (Therapeutic Activities)     [x]  97535 (Self Care)   [x]  All codes above (97110 - 97535)  []  97012 (Mechanical Traction)  []  97014 (E-stim Unattended)  []  97032 (E-stim manual)  []  45409 (Ionto)  []   97035 (Ultrasound) []  97750 (Physical Performance Training) [x]  [id number] (Aquatic Therapy) []  97016 (Vasopneumatic Device) []  [id number] (Paraffin) []  97034 (Contrast Bath) []  97597 (Wound Care 1st 20 sq cm) []  97598 (Wound Care each add'l 20 sq cm) []  97760 (Orthotic Fabrication, Fitting, Training Initial) []  [id number] (Prosthetic Management and Training Initial) []  [id number] (Orthotic or Prosthetic Training/ Modification Subsequent)

## 2022-12-28 ENCOUNTER — Ambulatory Visit (HOSPITAL_BASED_OUTPATIENT_CLINIC_OR_DEPARTMENT_OTHER): Payer: Medicare PPO | Admitting: Physical Therapy

## 2022-12-28 ENCOUNTER — Encounter (HOSPITAL_BASED_OUTPATIENT_CLINIC_OR_DEPARTMENT_OTHER): Payer: Self-pay | Admitting: Physical Therapy

## 2022-12-28 DIAGNOSIS — M25561 Pain in right knee: Secondary | ICD-10-CM | POA: Diagnosis not present

## 2022-12-28 DIAGNOSIS — R2689 Other abnormalities of gait and mobility: Secondary | ICD-10-CM

## 2022-12-28 DIAGNOSIS — R29898 Other symptoms and signs involving the musculoskeletal system: Secondary | ICD-10-CM | POA: Diagnosis not present

## 2022-12-28 DIAGNOSIS — M25661 Stiffness of right knee, not elsewhere classified: Secondary | ICD-10-CM

## 2022-12-28 DIAGNOSIS — M6281 Muscle weakness (generalized): Secondary | ICD-10-CM

## 2022-12-28 NOTE — Therapy (Signed)
OUTPATIENT PHYSICAL THERAPY LOWER EXTREMITY Treatment   Patient Name: Christie Beasley MRN: [id number] DOB:[birth year], 72 y.o., female Today's Date: 12/28/2022  END OF SESSION:  PT End of Session - 12/28/22 1350     Visit Number 6    Number of Visits 16    Date for PT Re-Evaluation 01/11/23    Authorization Type [employer]    PT Start Time 1351    PT Stop Time 1430    PT Time Calculation (min) 39 min    Activity Tolerance Patient tolerated treatment well    Behavior During Therapy WFL for tasks assessed/performed              Past Medical History:  Diagnosis Date   Acid reflux    Arthritis    Elevated lipids    Fibroid    History of fainting    pt reports she is a "fainter".     PONV (postoperative nausea and vomiting)    Rosacea    Thrombophlebitis 11/2000   Thyroid nodule 09/2002   Past Surgical History:  Procedure Laterality Date   BREAST EXCISIONAL BIOPSY Left    BREAST EXCISIONAL BIOPSY Right    CESAREAN SECTION     x 2   DILATION AND CURETTAGE OF UTERUS  1994   FOOT SURGERY Bilateral    hammer toes   HYSTEROSCOPY  01/11/2012   D&C   KNEE SURGERY Left 2012   MEDIAL PARTIAL KNEE REPLACEMENT Left 03/10/2015   THYROID LOBECTOMY Right 02/2003   TONSILLECTOMY AND ADENOIDECTOMY     TOTAL KNEE ARTHROPLASTY Right 11/27/2022   Procedure: RIGHT TOTAL KNEE ARTHROPLASTY;  Surgeon: Durene Romans, MD;  Location: [facility];  Service: Orthopedics;  Laterality: Right;   Patient Active Problem List   Diagnosis Date Noted   S/P total knee arthroplasty, right 11/27/2022   Educated about COVID-19 virus infection 07/07/2019   Palpitations 07/07/2019   Pain in right knee 07/11/2017   History of prosthetic unicompartmental arthroplasty of left knee 03/10/2015   Post-menopausal bleeding 11/12/2012   Vaginal atrophy 11/12/2012    PCP: Garth Bigness MD  REFERRING PROVIDER: Durene Romans, MD  REFERRING DIAG: M17.11 (ICD-10-CM) - Unilateral primary  osteoarthritis, right knee; S/P right total knee arthroplasty 11/27/22  THERAPY DIAG:  Right knee pain, unspecified chronicity  Stiffness of right knee, not elsewhere classified  Muscle weakness (generalized)  Other abnormalities of gait and mobility  Other symptoms and signs involving the musculoskeletal system  Rationale for Evaluation and Treatment: Rehabilitation  ONSET DATE: DOS 11/27/22  SUBJECTIVE:   SUBJECTIVE STATEMENT: The patient states been working on her knee extension.   PERTINENT HISTORY: R TKA PAIN:  Are you having pain? Yes: NPRS scale: 3/10 Pain location: R knee Pain description: achy, stabbing Aggravating factors: bending, walking Relieving factors: meds  PRECAUTIONS: None  WEIGHT BEARING RESTRICTIONS: No  FALLS:  Has patient fallen in last 6 months? No   PLOF: Independent  PATIENT GOALS: walk normally, stairs with good mechanics   OBJECTIVE: (objective measures from initial evaluation unless otherwise dated)  PATIENT SURVEYS:  FOTO 43% function  COGNITION: Overall cognitive status: Within functional limits for tasks assessed     SENSATION: WFL    POSTURE: No Significant postural limitations  PALPATION: TTP grossly in R knee, mostly hamstrings  LOWER EXTREMITY ROM:  Active ROM Right eval Left eval  12/13/22 12/20/22 12/26/22 12/28/22  Hip flexion         Hip extension  Hip abduction         Hip adduction         Hip internal rotation         Hip external rotation         Knee flexion 93 121 106 106 112 116 116  Knee extension Lacking 4 0  Lacking 5 Lacking 9 Lacking 7 Lacking 5 Improves to lacking 2  Ankle dorsiflexion         Ankle plantarflexion         Ankle inversion         Ankle eversion          (Blank rows = not tested) *= pain/symptoms  LOWER EXTREMITY MMT:  MMT Right eval Left eval  Hip flexion    Hip extension    Hip abduction    Hip adduction    Hip internal rotation    Hip external rotation     Knee flexion 4 5  Knee extension 3- 5  Ankle dorsiflexion    Ankle plantarflexion    Ankle inversion    Ankle eversion     (Blank rows = not tested) *= pain/symptoms    FUNCTIONAL TESTS:  5 times sit to stand: 32.09 seconds with UE use, relies LLE  GAIT: Distance walked: 100 feet Assistive device utilized: Environmental consultant - 2 wheeled Level of assistance: Modified independence Comments: RW, antalgic on R   TODAY'S TREATMENT:                                                                                                                              DATE:  12/28/22 Recumbent bike 5 minutes full revolutions for dynamic warm up  SLR 1x10 Manual: extension ROM with overpressure, patellar mobilizations TKE GTB 10 x 10 second holds STS with  GTB at knees 1 x 10 Step up 4 inch 2 x 10  Standing hip abduction GTB at knees 1 x 10   12/26/22 Recumbent bike 5 minutes rocking for dynamic warm up  SLR 1x10 Prone knee hang 3 minutes Prone quad set 10 x 10 second holds Manual: extension ROM with overpressure, patellar mobilizations Supine active hamstring stretch 10 x 10 second holds Contract relax into extensions 10 x 5 second holds Single leg stance 3 way reach 5 x 5 second holds     12/20/22 Recumbent bike 5 minutes rocking for dynamic warm up  Quad set 5 x 10 SLR 1x10  TKE GTB 10 x 10 second holds Retrograde edema massage  10 minutes Mini squat 1 x 10 LAQ 10 x 5 second holds  12/13/22 Recumbent bike 5 minutes rocking for dynamic warm up  Heel slides 5 x 10 second holds SLR 3x10  SAQ 2x10 with 5 second holds TKE GTB 10 x 10 second holds HR 1 x 20 TR 1 x 20 Mini squat 2 x 10 Standing hip abduction 2 x 10 bilateral    10/17 Quad sets x15  SLR 2x10  SAQ 2x10   Heel raise 2x10  Standing slow march 2x10      11/30/22 Ankle pumps 1 x 10 Quad sets 10 x 10 second holds Heel slides 10 x 10 second holds HS isometric 10 x 10 second holds SAQ 10x 5 second holds Heel prop  and elevation    PATIENT EDUCATION:  Education details: Patient educated on exam findings, POC, scope of PT, HEP, and knee positioning at rest, elevation. Person educated: Patient Education method: Explanation, Demonstration, and Handouts Education comprehension: verbalized understanding, returned demonstration, verbal cues required, and tactile cues required  HOME EXERCISE PROGRAM: Access Code: [email or web address] URL: [email or web address] Date: 11/30/2022 - Seated Ankle Pumps on Table  - 5 x daily - 7 x weekly - 1 sets - 20 reps - Supine Quadricep Sets  - 5 x daily - 7 x weekly - 10 reps - 10 second hold - Supine Heel Slide with Strap (Mirrored)  - 5 x daily - 7 x weekly - 10 reps - 10 second hold - Supine Isometric Hamstring Set  - 5 x daily - 7 x weekly - 10 reps - 10 second hold - Supine Knee Extension Strengthening (Mirrored)  - 5 x daily - 7 x weekly - 10 reps - 5 second hold  12/13/22 - Standing Terminal Knee Extension with Resistance  - 1 x daily - 7 x weekly - 2 sets - 10 reps - 10 second hold - Standing Hip Abduction with Counter Support  - 1 x daily - 7 x weekly - 2 sets - 10 reps - Mini Squat with Counter Support  - 1 x daily - 7 x weekly - 2 sets - 10 reps  12/20/22- Seated Long Arc Quad  - 1 x daily - 7 x weekly - 1-2 sets - 10 reps 12/28/22 - Sit to Stand with Arms Crossed  - 1 x daily - 7 x weekly - 3 sets - 10 reps  ASSESSMENT:  CLINICAL IMPRESSION: Began session on bike for dynamic warm up and conditioning. Patient with ROM from lacking 5 to 116. Improving flexion and extension. Lacking TKE with SLR but no lag. Improved to lacking 2 following manual. Some dynamic knee valgus with STS/step exercises which improves with cueing. Patient will continue to benefit from physical therapy in order to improve function and reduce impairment.      Eval:Patient a 72 y.o. y.o. female who was seen today for physical therapy evaluation and treatment for S/P right total knee  arthroplasty 11/27/22. Patient presents with pain limited deficits in R knee strength, ROM, endurance, activity tolerance, gait, balance, and functional mobility with ADL. Patient is having to modify and restrict ADL as indicated by outcome measure score as well as subjective information and objective measures which is affecting overall participation. Patient will benefit from skilled physical therapy in order to improve function and reduce impairment.  OBJECTIVE IMPAIRMENTS: Abnormal gait, decreased activity tolerance, decreased balance, decreased endurance, decreased mobility, difficulty walking, decreased ROM, decreased strength, improper body mechanics, and pain.   ACTIVITY LIMITATIONS: carrying, lifting, bending, standing, squatting, sleeping, stairs, transfers, toileting, dressing, hygiene/grooming, locomotion level, and caring for others  PARTICIPATION LIMITATIONS: meal prep, cleaning, laundry, driving, shopping, community activity, and yard work  PERSONAL FACTORS: Time since onset of injury/illness/exacerbation are also affecting patient's functional outcome.   REHAB POTENTIAL: Good  CLINICAL DECISION MAKING: Stable/uncomplicated  EVALUATION COMPLEXITY: Low   GOALS: Goals reviewed with patient? Yes  SHORT TERM GOALS: Target date: 12/28/2022  Patient will be independent with HEP in order to improve functional outcomes. Baseline: Goal status: INITIAL  2.  Patient will report at least 25% improvement in symptoms for improved quality of life. Baseline: Goal status: INITIAL    LONG TERM GOALS: Target date: 01/25/2023    Patient will report at least 75% improvement in symptoms for improved quality of life. Baseline:  Goal status: INITIAL  2.  Patient will improve FOTO score by at least 32 points in order to indicate improved tolerance to activity. Baseline: 43% function Goal status: INITIAL  3.  Patient will be able to navigate stairs with reciprocal pattern without  compensation in order to demonstrate improved LE strength. Baseline:  Goal status: INITIAL  4.  Patient will improve ROM for R knee extension/flexion to 0-120 degrees to improve squatting, and other functional mobility. Baseline: lacking 4 to 93 Goal status: INITIAL  5.  Patient will be able to complete 5x STS in under 11.4 seconds in order to reduce the risk of falls. Baseline:  Goal status: INITIAL  6.  Patient will demonstrate grade of 5/5 MMT grade in all tested musculature as evidence of improved strength to assist with stair ambulation and gait.   Baseline:  Goal status: INITIAL   PLAN:  PT FREQUENCY: 1-2x/week  PT DURATION: 8 weeks  PLANNED INTERVENTIONS: Therapeutic exercises, Therapeutic activity, Neuromuscular re-education, Balance training, Gait training, Patient/Family education, Joint manipulation, Joint mobilization, Stair training, Orthotic/Fit training, DME instructions, Aquatic Therapy, Dry Needling, Electrical stimulation, Spinal manipulation, Spinal mobilization, Cryotherapy, Moist heat, Compression bandaging, scar mobilization, Splintting, Taping, Traction, Ultrasound, Ionotophoresis 4mg /ml Dexamethasone, and Manual therapy  PLAN FOR NEXT SESSION: Continue L knee mobility and strength and progress as able   Wyman Songster, PT 12/28/2022, 1:52 PM   Referring diagnosis? M17.11 (ICD-10-CM) - Unilateral primary osteoarthritis, right knee Treatment diagnosis? (if different than referring diagnosis) M25.561 What was this (referring dx) caused by? [x]  Surgery []  Fall []  Ongoing issue []  Arthritis []  Other: ____________  Laterality: [x]  Rt []  Lt []  Both  Check all possible CPT codes:  *CHOOSE 10 OR LESS*    [x]  97110 (Therapeutic Exercise)  []  92507 (SLP Treatment)  [x]  97112 (Neuro Re-ed)   []  92526 (Swallowing Treatment)   [x]  97116 (Gait Training)   []  [id number] (Cognitive Training, 1st 15 minutes) [x]  97140 (Manual Therapy)   []  97130 (Cognitive  Training, each add'l 15 minutes)  [x]  97164 (Re-evaluation)                              []  Other, List CPT Code ____________  [x]  97530 (Therapeutic Activities)     [x]  97535 (Self Care)   [x]  All codes above (97110 - 97535)  []  97012 (Mechanical Traction)  []  97014 (E-stim Unattended)  []  97032 (E-stim manual)  []  97033 (Ionto)  []  97035 (Ultrasound) []  97750 (Physical Performance Training) [x]  [id number] (Aquatic Therapy) []  97016 (Vasopneumatic Device) []  [id number] (Paraffin) []  97034 (Contrast Bath) []  97597 (Wound Care 1st 20 sq cm) []  81191 (Wound Care each add'l 20 sq cm) []  97760 (Orthotic Fabrication, Fitting, Training Initial) []  [id number] (Prosthetic Management and Training Initial) []  [id number] (Orthotic or Prosthetic Training/ Modification Subsequent)

## 2023-01-02 ENCOUNTER — Encounter (HOSPITAL_BASED_OUTPATIENT_CLINIC_OR_DEPARTMENT_OTHER): Payer: Self-pay | Admitting: Physical Therapy

## 2023-01-02 ENCOUNTER — Ambulatory Visit (HOSPITAL_BASED_OUTPATIENT_CLINIC_OR_DEPARTMENT_OTHER): Payer: Medicare PPO | Admitting: Physical Therapy

## 2023-01-02 DIAGNOSIS — R29898 Other symptoms and signs involving the musculoskeletal system: Secondary | ICD-10-CM | POA: Diagnosis not present

## 2023-01-02 DIAGNOSIS — M6281 Muscle weakness (generalized): Secondary | ICD-10-CM | POA: Diagnosis not present

## 2023-01-02 DIAGNOSIS — M25661 Stiffness of right knee, not elsewhere classified: Secondary | ICD-10-CM | POA: Diagnosis not present

## 2023-01-02 DIAGNOSIS — M25561 Pain in right knee: Secondary | ICD-10-CM | POA: Diagnosis not present

## 2023-01-02 DIAGNOSIS — R2689 Other abnormalities of gait and mobility: Secondary | ICD-10-CM

## 2023-01-02 NOTE — Therapy (Addendum)
OUTPATIENT PHYSICAL THERAPY LOWER EXTREMITY TREATMENT   Patient Name: Christie Beasley MRN: [id number] DOB:[birth year], 72 y.o., female Today's Date: 01/02/2023  END OF SESSION:  PT End of Session - 01/02/23 1451     Visit Number 7    Number of Visits 16    Date for PT Re-Evaluation 01/11/23    Authorization Type [employer]    PT Start Time 1346    PT Stop Time 1426    PT Time Calculation (min) 40 min    Activity Tolerance Patient tolerated treatment well;No increased pain    Behavior During Therapy WFL for tasks assessed/performed               Past Medical History:  Diagnosis Date   Acid reflux    Arthritis    Elevated lipids    Fibroid    History of fainting    pt reports she is a "fainter".     PONV (postoperative nausea and vomiting)    Rosacea    Thrombophlebitis 11/2000   Thyroid nodule 09/2002   Past Surgical History:  Procedure Laterality Date   BREAST EXCISIONAL BIOPSY Left    BREAST EXCISIONAL BIOPSY Right    CESAREAN SECTION     x 2   DILATION AND CURETTAGE OF UTERUS  1994   FOOT SURGERY Bilateral    hammer toes   HYSTEROSCOPY  01/11/2012   D&C   KNEE SURGERY Left 2012   MEDIAL PARTIAL KNEE REPLACEMENT Left 03/10/2015   THYROID LOBECTOMY Right 02/2003   TONSILLECTOMY AND ADENOIDECTOMY     TOTAL KNEE ARTHROPLASTY Right 11/27/2022   Procedure: RIGHT TOTAL KNEE ARTHROPLASTY;  Surgeon: Durene Romans, MD;  Location: [facility];  Service: Orthopedics;  Laterality: Right;   Patient Active Problem List   Diagnosis Date Noted   S/P total knee arthroplasty, right 11/27/2022   Educated about COVID-19 virus infection 07/07/2019   Palpitations 07/07/2019   Pain in right knee 07/11/2017   History of prosthetic unicompartmental arthroplasty of left knee 03/10/2015   Post-menopausal bleeding 11/12/2012   Vaginal atrophy 11/12/2012    PCP: Garth Bigness MD  REFERRING PROVIDER: Durene Romans, MD  REFERRING DIAG: M17.11 (ICD-10-CM) -  Unilateral primary osteoarthritis, right knee; S/P right total knee arthroplasty 11/27/22  THERAPY DIAG:  Right knee pain, unspecified chronicity  Muscle weakness (generalized)  Other abnormalities of gait and mobility  Stiffness of right knee, not elsewhere classified  Rationale for Evaluation and Treatment: Rehabilitation  ONSET DATE: DOS 11/27/22  SUBJECTIVE:   SUBJECTIVE STATEMENT: 01/02/2023 Pt states she has been having trouble sleeping secondary to pain in her R knee. She is able to sleep at a max of two hours before waking up from pain. Overall the pain is much better but is consistently worse at night.   PERTINENT HISTORY: R TKA PAIN:  Are you having pain? Yes: NPRS scale: 3/10 Pain location: R knee Pain description: achy, stabbing Aggravating factors: bending, walking Relieving factors: meds  PRECAUTIONS: None  WEIGHT BEARING RESTRICTIONS: No  FALLS:  Has patient fallen in last 6 months? No   PLOF: Independent  PATIENT GOALS: walk normally, stairs with good mechanics   OBJECTIVE: (objective measures from initial evaluation unless otherwise dated)  PATIENT SURVEYS:  FOTO 43% function FOTO: 65% (01/02/23)  COGNITION: Overall cognitive status: Within functional limits for tasks assessed     SENSATION: WFL    POSTURE: No Significant postural limitations  PALPATION: TTP grossly in R knee, mostly hamstrings  LOWER EXTREMITY ROM:  Active ROM Right eval Left eval  12/13/22 12/20/22 12/26/22 12/28/22  Hip flexion         Hip extension         Hip abduction         Hip adduction         Hip internal rotation         Hip external rotation         Knee flexion 93 121 106 106 112 116 116  Knee extension Lacking 4 0  Lacking 5 Lacking 9 Lacking 7 Lacking 5 Improves to lacking 2  Ankle dorsiflexion         Ankle plantarflexion         Ankle inversion         Ankle eversion          (Blank rows = not tested) *= pain/symptoms  LOWER EXTREMITY  MMT:  MMT Right eval Left eval  Hip flexion    Hip extension    Hip abduction    Hip adduction    Hip internal rotation    Hip external rotation    Knee flexion 4 5  Knee extension 3- 5  Ankle dorsiflexion    Ankle plantarflexion    Ankle inversion    Ankle eversion     (Blank rows = not tested) *= pain/symptoms    FUNCTIONAL TESTS:  5 times sit to stand: 32.09 seconds with UE use, relies LLE  GAIT: Distance walked: 100 feet Assistive device utilized: Environmental consultant - 2 wheeled Level of assistance: Modified independence Comments: RW, antalgic on R   TODAY'S TREATMENT:                                                                                                                              DATE:  01/02/2023 Recumbent bike 5 minutes full revolutions for dynamic warm up  SLR 1x10 Manual: extension ROM with overpressure, patellar mobilizations TKE: GTB 10 x 10 second holds STS with GTB at knees 1 x 10 Step up R LE: 4 inch 2 x 10  Standing hip abduction GTB at knees 1 x 10  Backwards walking 4x laps of 25 ft Lateral step ups: 4 inch 2x12    12/28/22 Recumbent bike 5 minutes full revolutions for dynamic warm up  SLR 1x10 Manual: extension ROM with overpressure, patellar mobilizations TKE GTB 10 x 10 second holds STS with  GTB at knees 1 x 10 Step up 4 inch 2 x 10  Standing hip abduction GTB at knees 1 x 10   12/26/22 Recumbent bike 5 minutes rocking for dynamic warm up  SLR 1x10 Prone knee hang 3 minutes Prone quad set 10 x 10 second holds Manual: extension ROM with overpressure, patellar mobilizations Supine active hamstring stretch 10 x 10 second holds Contract relax into extensions 10 x 5 second holds Single leg stance 3 way reach 5 x 5 second holds     12/20/22  Recumbent bike 5 minutes rocking for dynamic warm up  Quad set 5 x 10 SLR 1x10  TKE GTB 10 x 10 second holds Retrograde edema massage  10 minutes Mini squat 1 x 10 LAQ 10 x 5 second  holds  12/13/22 Recumbent bike 5 minutes rocking for dynamic warm up  Heel slides 5 x 10 second holds SLR 3x10  SAQ 2x10 with 5 second holds TKE GTB 10 x 10 second holds HR 1 x 20 TR 1 x 20 Mini squat 2 x 10 Standing hip abduction 2 x 10 bilateral    10/17 Quad sets x15 SLR 2x10  SAQ 2x10   Heel raise 2x10  Standing slow march 2x10      11/30/22 Ankle pumps 1 x 10 Quad sets 10 x 10 second holds Heel slides 10 x 10 second holds HS isometric 10 x 10 second holds SAQ 10x 5 second holds Heel prop and elevation    PATIENT EDUCATION:  Education details: Patient educated on exam findings, POC, scope of PT, HEP, and knee positioning at rest, elevation. Person educated: Patient Education method: Explanation, Demonstration, and Handouts Education comprehension: verbalized understanding, returned demonstration, verbal cues required, and tactile cues required  HOME EXERCISE PROGRAM: Access Code: [email or web address] URL: [email or web address] Date: 11/30/2022 - Seated Ankle Pumps on Table  - 5 x daily - 7 x weekly - 1 sets - 20 reps - Supine Quadricep Sets  - 5 x daily - 7 x weekly - 10 reps - 10 second hold - Supine Heel Slide with Strap (Mirrored)  - 5 x daily - 7 x weekly - 10 reps - 10 second hold - Supine Isometric Hamstring Set  - 5 x daily - 7 x weekly - 10 reps - 10 second hold - Supine Knee Extension Strengthening (Mirrored)  - 5 x daily - 7 x weekly - 10 reps - 5 second hold  12/13/22 - Standing Terminal Knee Extension with Resistance  - 1 x daily - 7 x weekly - 2 sets - 10 reps - 10 second hold - Standing Hip Abduction with Counter Support  - 1 x daily - 7 x weekly - 2 sets - 10 reps - Mini Squat with Counter Support  - 1 x daily - 7 x weekly - 2 sets - 10 reps  12/20/22- Seated Long Arc Quad  - 1 x daily - 7 x weekly - 1-2 sets - 10 reps 12/28/22 - Sit to Stand with Arms Crossed  - 1 x daily - 7 x weekly - 3 sets - 10 reps  ASSESSMENT:  CLINICAL  IMPRESSION: 01/02/2023 Session focused on promoting R LE ROM and global strengthening. Pt's ROM is improving as she was able to achieve 121 of R knee AROM flexion. Pt continues to have limitations with AROM R knee extension & achieved -5 following STM today and demonstrated L hip drop secondary to muscular fatigue. Pt able to perform all exercises without increased pain or modification. Continue to progress LE strengthening and ROM as tolerated.   Eval:Patient a 72 y.o. y.o. female who was seen today for physical therapy evaluation and treatment for S/P right total knee arthroplasty 11/27/22. Patient presents with pain limited deficits in R knee strength, ROM, endurance, activity tolerance, gait, balance, and functional mobility with ADL. Patient is having to modify and restrict ADL as indicated by outcome measure score as well as subjective information and objective measures which is affecting overall participation. Patient will benefit from skilled physical therapy in order  to improve function and reduce impairment.  OBJECTIVE IMPAIRMENTS: Abnormal gait, decreased activity tolerance, decreased balance, decreased endurance, decreased mobility, difficulty walking, decreased ROM, decreased strength, improper body mechanics, and pain.   ACTIVITY LIMITATIONS: carrying, lifting, bending, standing, squatting, sleeping, stairs, transfers, toileting, dressing, hygiene/grooming, locomotion level, and caring for others  PARTICIPATION LIMITATIONS: meal prep, cleaning, laundry, driving, shopping, community activity, and yard work  PERSONAL FACTORS: Time since onset of injury/illness/exacerbation are also affecting patient's functional outcome.   REHAB POTENTIAL: Good  CLINICAL DECISION MAKING: Stable/uncomplicated  EVALUATION COMPLEXITY: Low   GOALS: Goals reviewed with patient? Yes  SHORT TERM GOALS: Target date: 12/28/2022    Patient will be independent with HEP in order to improve functional  outcomes. Baseline: Goal status: INITIAL  2.  Patient will report at least 25% improvement in symptoms for improved quality of life. Baseline: Goal status: INITIAL    LONG TERM GOALS: Target date: 01/25/2023    Patient will report at least 75% improvement in symptoms for improved quality of life. Baseline:  Goal status: INITIAL  2.  Patient will improve FOTO score by at least 32 points in order to indicate improved tolerance to activity. Baseline: 43% function Goal status: INITIAL  3.  Patient will be able to navigate stairs with reciprocal pattern without compensation in order to demonstrate improved LE strength. Baseline:  Goal status: INITIAL  4.  Patient will improve ROM for R knee extension/flexion to 0-120 degrees to improve squatting, and other functional mobility. Baseline: lacking 4 to 93 Goal status: INITIAL  5.  Patient will be able to complete 5x STS in under 11.4 seconds in order to reduce the risk of falls. Baseline:  Goal status: INITIAL  6.  Patient will demonstrate grade of 5/5 MMT grade in all tested musculature as evidence of improved strength to assist with stair ambulation and gait.   Baseline:  Goal status: INITIAL   PLAN:  PT FREQUENCY: 1-2x/week  PT DURATION: 8 weeks  PLANNED INTERVENTIONS: Therapeutic exercises, Therapeutic activity, Neuromuscular re-education, Balance training, Gait training, Patient/Family education, Joint manipulation, Joint mobilization, Stair training, Orthotic/Fit training, DME instructions, Aquatic Therapy, Dry Needling, Electrical stimulation, Spinal manipulation, Spinal mobilization, Cryotherapy, Moist heat, Compression bandaging, scar mobilization, Splintting, Taping, Traction, Ultrasound, Ionotophoresis 4mg /ml Dexamethasone, and Manual therapy  PLAN FOR NEXT SESSION: Continue L knee mobility and strength and progress as able   [employer], Student-PT  This entire session was performed under direct supervision and  direction of a licensed Estate agent . I have personally read, edited and approve of the note as written.  01/02/2023, 3:30 PM  Lorayne Bender PT DPT   Referring diagnosis? M17.11 (ICD-10-CM) - Unilateral primary osteoarthritis, right knee Treatment diagnosis? (if different than referring diagnosis) M25.561 What was this (referring dx) caused by? [x]  Surgery []  Fall []  Ongoing issue []  Arthritis []  Other: ____________  Laterality: [x]  Rt []  Lt []  Both  Check all possible CPT codes:  *CHOOSE 10 OR LESS*    [x]  97110 (Therapeutic Exercise)  []  92507 (SLP Treatment)  [x]  16109 (Neuro Re-ed)   []  92526 (Swallowing Treatment)   [x]  97116 (Gait Training)   []  [id number] (Cognitive Training, 1st 15 minutes) [x]  97140 (Manual Therapy)   []  97130 (Cognitive Training, each add'l 15 minutes)  [x]  97164 (Re-evaluation)                              []  Other, List CPT Code ____________  [x]   16109 (Therapeutic Activities)     [x]  97535 (Self Care)   [x]  All codes above (97110 - 97535)  []  97012 (Mechanical Traction)  []  97014 (E-stim Unattended)  []  97032 (E-stim manual)  []  97033 (Ionto)  []  97035 (Ultrasound) []  97750 (Physical Performance Training) [x]  [id number] (Aquatic Therapy) []  97016 (Vasopneumatic Device) []  [id number] (Paraffin) []  97034 (Contrast Bath) []  97597 (Wound Care 1st 20 sq cm) []  97598 (Wound Care each add'l 20 sq cm) []  97760 (Orthotic Fabrication, Fitting, Training Initial) []  [id number] (Prosthetic Management and Training Initial) []  [id number] (Orthotic or Prosthetic Training/ Modification Subsequent)

## 2023-01-03 ENCOUNTER — Encounter (HOSPITAL_BASED_OUTPATIENT_CLINIC_OR_DEPARTMENT_OTHER): Payer: Self-pay | Admitting: Physical Therapy

## 2023-01-04 ENCOUNTER — Ambulatory Visit (HOSPITAL_BASED_OUTPATIENT_CLINIC_OR_DEPARTMENT_OTHER): Payer: Medicare PPO | Admitting: Physical Therapy

## 2023-01-04 ENCOUNTER — Encounter (HOSPITAL_BASED_OUTPATIENT_CLINIC_OR_DEPARTMENT_OTHER): Payer: Self-pay | Admitting: Physical Therapy

## 2023-01-04 DIAGNOSIS — R2689 Other abnormalities of gait and mobility: Secondary | ICD-10-CM | POA: Diagnosis not present

## 2023-01-04 DIAGNOSIS — M25561 Pain in right knee: Secondary | ICD-10-CM | POA: Diagnosis not present

## 2023-01-04 DIAGNOSIS — R29898 Other symptoms and signs involving the musculoskeletal system: Secondary | ICD-10-CM

## 2023-01-04 DIAGNOSIS — M6281 Muscle weakness (generalized): Secondary | ICD-10-CM

## 2023-01-04 DIAGNOSIS — M25661 Stiffness of right knee, not elsewhere classified: Secondary | ICD-10-CM | POA: Diagnosis not present

## 2023-01-04 NOTE — Therapy (Signed)
OUTPATIENT PHYSICAL THERAPY LOWER EXTREMITY TREATMENT   Patient Name: Christie Beasley MRN: [id number] DOB:[birth year], 72 y.o., female Today's Date: 01/04/2023  END OF SESSION:  PT End of Session - 01/04/23 1338     Visit Number 8    Number of Visits 16    Date for PT Re-Evaluation 01/11/23    Authorization Type [employer]    PT Start Time 1340    PT Stop Time 1423    PT Time Calculation (min) 43 min    Activity Tolerance Patient tolerated treatment well;No increased pain    Behavior During Therapy WFL for tasks assessed/performed               Past Medical History:  Diagnosis Date   Acid reflux    Arthritis    Elevated lipids    Fibroid    History of fainting    pt reports she is a "fainter".     PONV (postoperative nausea and vomiting)    Rosacea    Thrombophlebitis 11/2000   Thyroid nodule 09/2002   Past Surgical History:  Procedure Laterality Date   BREAST EXCISIONAL BIOPSY Left    BREAST EXCISIONAL BIOPSY Right    CESAREAN SECTION     x 2   DILATION AND CURETTAGE OF UTERUS  1994   FOOT SURGERY Bilateral    hammer toes   HYSTEROSCOPY  01/11/2012   D&C   KNEE SURGERY Left 2012   MEDIAL PARTIAL KNEE REPLACEMENT Left 03/10/2015   THYROID LOBECTOMY Right 02/2003   TONSILLECTOMY AND ADENOIDECTOMY     TOTAL KNEE ARTHROPLASTY Right 11/27/2022   Procedure: RIGHT TOTAL KNEE ARTHROPLASTY;  Surgeon: Durene Romans, MD;  Location: [facility];  Service: Orthopedics;  Laterality: Right;   Patient Active Problem List   Diagnosis Date Noted   S/P total knee arthroplasty, right 11/27/2022   Educated about COVID-19 virus infection 07/07/2019   Palpitations 07/07/2019   Pain in right knee 07/11/2017   History of prosthetic unicompartmental arthroplasty of left knee 03/10/2015   Post-menopausal bleeding 11/12/2012   Vaginal atrophy 11/12/2012    PCP: Garth Bigness MD  REFERRING PROVIDER: Durene Romans, MD  REFERRING DIAG: M17.11 (ICD-10-CM) -  Unilateral primary osteoarthritis, right knee; S/P right total knee arthroplasty 11/27/22  THERAPY DIAG:  Muscle weakness (generalized)  Stiffness of right knee, not elsewhere classified  Right knee pain, unspecified chronicity  Other abnormalities of gait and mobility  Other symptoms and signs involving the musculoskeletal system  Rationale for Evaluation and Treatment: Rehabilitation  ONSET DATE: DOS 11/27/22  SUBJECTIVE:   SUBJECTIVE STATEMENT: Patient states knee is feeling alright. Was a little sore after last time. Pain at night still.   PERTINENT HISTORY: R TKA PAIN:  Are you having pain? Yes: NPRS scale: 1-2/10 Pain location: R knee Pain description: achy, stabbing Aggravating factors: bending, walking Relieving factors: meds  PRECAUTIONS: None  WEIGHT BEARING RESTRICTIONS: No  FALLS:  Has patient fallen in last 6 months? No   PLOF: Independent  PATIENT GOALS: walk normally, stairs with good mechanics   OBJECTIVE: (objective measures from initial evaluation unless otherwise dated)  PATIENT SURVEYS:  FOTO 43% function FOTO: 65% (01/02/23)  COGNITION: Overall cognitive status: Within functional limits for tasks assessed     SENSATION: WFL    POSTURE: No Significant postural limitations  PALPATION: TTP grossly in R knee, mostly hamstrings  LOWER EXTREMITY ROM:  Active ROM Right eval Left eval  12/13/22 12/20/22 12/26/22 12/28/22 01/04/23  Hip flexion  Hip extension          Hip abduction          Hip adduction          Hip internal rotation          Hip external rotation          Knee flexion 93 121 106 106 112 116 116 123  Knee extension Lacking 4 0  Lacking 5 Lacking 9 Lacking 7 Lacking 5 Improves to lacking 2 Lacking 7  Ankle dorsiflexion          Ankle plantarflexion          Ankle inversion          Ankle eversion           (Blank rows = not tested) *= pain/symptoms  LOWER EXTREMITY MMT:  MMT Right eval Left eval   Hip flexion    Hip extension    Hip abduction    Hip adduction    Hip internal rotation    Hip external rotation    Knee flexion 4 5  Knee extension 3- 5  Ankle dorsiflexion    Ankle plantarflexion    Ankle inversion    Ankle eversion     (Blank rows = not tested) *= pain/symptoms    FUNCTIONAL TESTS:  5 times sit to stand: 32.09 seconds with UE use, relies LLE  GAIT: Distance walked: 100 feet Assistive device utilized: Environmental consultant - 2 wheeled Level of assistance: Modified independence Comments: RW, antalgic on R   TODAY'S TREATMENT:                                                                                                                              DATE:  01/04/2023 Recumbent bike 5 minutes level 3 full revolutions for dynamic warm up  Manual: extension ROM with overpressure, patellar mobilizations, retrograde edema massage Prone contract relax into extension 2 x 10 x 5 second holds Lateral step ups: 4 inch 2x12   01/02/23 Recumbent bike 5 minutes full revolutions for dynamic warm up  SLR 1x10 Manual: extension ROM with overpressure, patellar mobilizations TKE: GTB 10 x 10 second holds STS with GTB at knees 1 x 10 Step up R LE: 4 inch 2 x 10  Standing hip abduction GTB at knees 1 x 10  Backwards walking 4x laps of 25 ft Lateral step ups: 4 inch 2x12    12/28/22 Recumbent bike 5 minutes full revolutions for dynamic warm up  SLR 1x10 Manual: extension ROM with overpressure, patellar mobilizations TKE GTB 10 x 10 second holds STS with  GTB at knees 1 x 10 Step up 4 inch 2 x 10  Standing hip abduction GTB at knees 1 x 10   12/26/22 Recumbent bike 5 minutes rocking for dynamic warm up  SLR 1x10 Prone knee hang 3 minutes Prone quad set 10 x 10 second holds Manual: extension ROM with overpressure, patellar mobilizations  Supine active hamstring stretch 10 x 10 second holds Contract relax into extensions 10 x 5 second holds Single leg stance 3 way reach 5 x 5  second holds     12/20/22 Recumbent bike 5 minutes rocking for dynamic warm up  Quad set 5 x 10 SLR 1x10  TKE GTB 10 x 10 second holds Retrograde edema massage  10 minutes Mini squat 1 x 10 LAQ 10 x 5 second holds  12/13/22 Recumbent bike 5 minutes rocking for dynamic warm up  Heel slides 5 x 10 second holds SLR 3x10  SAQ 2x10 with 5 second holds TKE GTB 10 x 10 second holds HR 1 x 20 TR 1 x 20 Mini squat 2 x 10 Standing hip abduction 2 x 10 bilateral    10/17 Quad sets x15 SLR 2x10  SAQ 2x10   Heel raise 2x10  Standing slow march 2x10      11/30/22 Ankle pumps 1 x 10 Quad sets 10 x 10 second holds Heel slides 10 x 10 second holds HS isometric 10 x 10 second holds SAQ 10x 5 second holds Heel prop and elevation    PATIENT EDUCATION:  Education details: Patient educated on exam findings, POC, scope of PT, HEP, and knee positioning at rest, elevation. Person educated: Patient Education method: Explanation, Demonstration, and Handouts Education comprehension: verbalized understanding, returned demonstration, verbal cues required, and tactile cues required  HOME EXERCISE PROGRAM: Access Code: [email or web address] URL: [email or web address] Date: 11/30/2022 - Seated Ankle Pumps on Table  - 5 x daily - 7 x weekly - 1 sets - 20 reps - Supine Quadricep Sets  - 5 x daily - 7 x weekly - 10 reps - 10 second hold - Supine Heel Slide with Strap (Mirrored)  - 5 x daily - 7 x weekly - 10 reps - 10 second hold - Supine Isometric Hamstring Set  - 5 x daily - 7 x weekly - 10 reps - 10 second hold - Supine Knee Extension Strengthening (Mirrored)  - 5 x daily - 7 x weekly - 10 reps - 5 second hold  12/13/22 - Standing Terminal Knee Extension with Resistance  - 1 x daily - 7 x weekly - 2 sets - 10 reps - 10 second hold - Standing Hip Abduction with Counter Support  - 1 x daily - 7 x weekly - 2 sets - 10 reps - Mini Squat with Counter Support  - 1 x daily - 7 x weekly - 2  sets - 10 reps  12/20/22- Seated Long Arc Quad  - 1 x daily - 7 x weekly - 1-2 sets - 10 reps 12/28/22 - Sit to Stand with Arms Crossed  - 1 x daily - 7 x weekly - 3 sets - 10 reps  ASSESSMENT:  CLINICAL IMPRESSION: Patient lacking 7 to 123. Improves to lacking 4. Continued with manual for edema and extension ROM.  Patient will continue to benefit from physical therapy in order to improve function and reduce impairment.    Eval:Patient a 72 y.o. y.o. female who was seen today for physical therapy evaluation and treatment for S/P right total knee arthroplasty 11/27/22. Patient presents with pain limited deficits in R knee strength, ROM, endurance, activity tolerance, gait, balance, and functional mobility with ADL. Patient is having to modify and restrict ADL as indicated by outcome measure score as well as subjective information and objective measures which is affecting overall participation. Patient will benefit from skilled physical therapy in order to improve function  and reduce impairment.  OBJECTIVE IMPAIRMENTS: Abnormal gait, decreased activity tolerance, decreased balance, decreased endurance, decreased mobility, difficulty walking, decreased ROM, decreased strength, improper body mechanics, and pain.   ACTIVITY LIMITATIONS: carrying, lifting, bending, standing, squatting, sleeping, stairs, transfers, toileting, dressing, hygiene/grooming, locomotion level, and caring for others  PARTICIPATION LIMITATIONS: meal prep, cleaning, laundry, driving, shopping, community activity, and yard work  PERSONAL FACTORS: Time since onset of injury/illness/exacerbation are also affecting patient's functional outcome.   REHAB POTENTIAL: Good  CLINICAL DECISION MAKING: Stable/uncomplicated  EVALUATION COMPLEXITY: Low   GOALS: Goals reviewed with patient? Yes  SHORT TERM GOALS: Target date: 12/28/2022    Patient will be independent with HEP in order to improve functional outcomes. Baseline: Goal  status: INITIAL  2.  Patient will report at least 25% improvement in symptoms for improved quality of life. Baseline: Goal status: INITIAL    LONG TERM GOALS: Target date: 01/25/2023    Patient will report at least 75% improvement in symptoms for improved quality of life. Baseline:  Goal status: INITIAL  2.  Patient will improve FOTO score by at least 32 points in order to indicate improved tolerance to activity. Baseline: 43% function Goal status: INITIAL  3.  Patient will be able to navigate stairs with reciprocal pattern without compensation in order to demonstrate improved LE strength. Baseline:  Goal status: INITIAL  4.  Patient will improve ROM for R knee extension/flexion to 0-120 degrees to improve squatting, and other functional mobility. Baseline: lacking 4 to 93 Goal status: INITIAL  5.  Patient will be able to complete 5x STS in under 11.4 seconds in order to reduce the risk of falls. Baseline:  Goal status: INITIAL  6.  Patient will demonstrate grade of 5/5 MMT grade in all tested musculature as evidence of improved strength to assist with stair ambulation and gait.   Baseline:  Goal status: INITIAL   PLAN:  PT FREQUENCY: 1-2x/week  PT DURATION: 8 weeks  PLANNED INTERVENTIONS: Therapeutic exercises, Therapeutic activity, Neuromuscular re-education, Balance training, Gait training, Patient/Family education, Joint manipulation, Joint mobilization, Stair training, Orthotic/Fit training, DME instructions, Aquatic Therapy, Dry Needling, Electrical stimulation, Spinal manipulation, Spinal mobilization, Cryotherapy, Moist heat, Compression bandaging, scar mobilization, Splintting, Taping, Traction, Ultrasound, Ionotophoresis 4mg /ml Dexamethasone, and Manual therapy  PLAN FOR NEXT SESSION: Continue L knee mobility and strength and progress as able   Wyman Songster, PT   01/04/2023, 2:22 PM   Referring diagnosis? M17.11 (ICD-10-CM) - Unilateral primary  osteoarthritis, right knee Treatment diagnosis? (if different than referring diagnosis) M25.561 What was this (referring dx) caused by? [x]  Surgery []  Fall []  Ongoing issue []  Arthritis []  Other: ____________  Laterality: [x]  Rt []  Lt []  Both  Check all possible CPT codes:  *CHOOSE 10 OR LESS*    [x]  45409 (Therapeutic Exercise)  []  92507 (SLP Treatment)  [x]  97112 (Neuro Re-ed)   []  92526 (Swallowing Treatment)   [x]  97116 (Gait Training)   []  [id number] (Cognitive Training, 1st 15 minutes) [x]  97140 (Manual Therapy)   []  97130 (Cognitive Training, each add'l 15 minutes)  [x]  97164 (Re-evaluation)                              []  Other, List CPT Code ____________  [x]  97530 (Therapeutic Activities)     [x]  97535 (Self Care)   [x]  All codes above (97110 - 97535)  []  97012 (Mechanical Traction)  []  97014 (E-stim Unattended)  []  97032 (E-stim manual)  []   75643 (Ionto)  []  [phone] (Ultrasound) []  97750 (Physical Performance Training) [x]  [phone] (Aquatic Therapy) []  97016 (Vasopneumatic Device) []  [id number] (Paraffin) []  97034 (Contrast Bath) []  97597 (Wound Care 1st 20 sq cm) []  97598 (Wound Care each add'l 20 sq cm) []  97760 (Orthotic Fabrication, Fitting, Training Initial) []  [id number] (Prosthetic Management and Training Initial) []  60630 (Orthotic or Prosthetic Training/ Modification Subsequent)

## 2023-01-07 ENCOUNTER — Ambulatory Visit (HOSPITAL_BASED_OUTPATIENT_CLINIC_OR_DEPARTMENT_OTHER): Payer: Medicare PPO | Admitting: Physical Therapy

## 2023-01-07 DIAGNOSIS — R29898 Other symptoms and signs involving the musculoskeletal system: Secondary | ICD-10-CM

## 2023-01-07 DIAGNOSIS — M6281 Muscle weakness (generalized): Secondary | ICD-10-CM | POA: Diagnosis not present

## 2023-01-07 DIAGNOSIS — M25561 Pain in right knee: Secondary | ICD-10-CM

## 2023-01-07 DIAGNOSIS — M25661 Stiffness of right knee, not elsewhere classified: Secondary | ICD-10-CM | POA: Diagnosis not present

## 2023-01-07 DIAGNOSIS — R2689 Other abnormalities of gait and mobility: Secondary | ICD-10-CM | POA: Diagnosis not present

## 2023-01-07 NOTE — Therapy (Signed)
OUTPATIENT PHYSICAL THERAPY LOWER EXTREMITY TREATMENT   Patient Name: Christie Beasley MRN: [id number] DOB:1950/05/24, 72 y.o., female Today's Date: 01/08/2023  END OF SESSION:  PT End of Session - 01/08/23 0908     Visit Number 9    Number of Visits 16    Date for PT Re-Evaluation 01/11/23    Authorization Type [employer]    PT Start Time 1315    PT Stop Time 1357    PT Time Calculation (min) 42 min    Activity Tolerance Patient tolerated treatment well;No increased pain    Behavior During Therapy WFL for tasks assessed/performed                Past Medical History:  Diagnosis Date   Acid reflux    Arthritis    Elevated lipids    Fibroid    History of fainting    pt reports she is a "fainter".     PONV (postoperative nausea and vomiting)    Rosacea    Thrombophlebitis 11/2000   Thyroid nodule 09/2002   Past Surgical History:  Procedure Laterality Date   BREAST EXCISIONAL BIOPSY Left    BREAST EXCISIONAL BIOPSY Right    CESAREAN SECTION     x 2   DILATION AND CURETTAGE OF UTERUS  1994   FOOT SURGERY Bilateral    hammer toes   HYSTEROSCOPY  01/11/2012   D&C   KNEE SURGERY Left 2012   MEDIAL PARTIAL KNEE REPLACEMENT Left 03/10/2015   THYROID LOBECTOMY Right 02/2003   TONSILLECTOMY AND ADENOIDECTOMY     TOTAL KNEE ARTHROPLASTY Right 11/27/2022   Procedure: RIGHT TOTAL KNEE ARTHROPLASTY;  Surgeon: Durene Romans, MD;  Location: [facility];  Service: Orthopedics;  Laterality: Right;   Patient Active Problem List   Diagnosis Date Noted   S/P total knee arthroplasty, right 11/27/2022   Educated about COVID-19 virus infection 07/07/2019   Palpitations 07/07/2019   Pain in right knee 07/11/2017   History of prosthetic unicompartmental arthroplasty of left knee 03/10/2015   Post-menopausal bleeding 11/12/2012   Vaginal atrophy 11/12/2012    PCP: Garth Bigness MD  REFERRING PROVIDER: Durene Romans, MD  REFERRING DIAG: M17.11 (ICD-10-CM) -  Unilateral primary osteoarthritis, right knee; S/P right total knee arthroplasty 11/27/22  THERAPY DIAG:  Muscle weakness (generalized)  Stiffness of right knee, not elsewhere classified  Other abnormalities of gait and mobility  Right knee pain, unspecified chronicity  Other symptoms and signs involving the musculoskeletal system  Rationale for Evaluation and Treatment: Rehabilitation  ONSET DATE: DOS 11/27/22  SUBJECTIVE:   SUBJECTIVE STATEMENT: Patient states knee is feeling alright. Was a little sore after last time. Pain at night still.   PERTINENT HISTORY: R TKA PAIN:  Are you having pain? Yes: NPRS scale: 1-2/10 Pain location: R knee Pain description: achy, stabbing Aggravating factors: bending, walking Relieving factors: meds  PRECAUTIONS: None  WEIGHT BEARING RESTRICTIONS: No  FALLS:  Has patient fallen in last 6 months? No   PLOF: Independent  PATIENT GOALS: walk normally, stairs with good mechanics   OBJECTIVE: (objective measures from initial evaluation unless otherwise dated)  PATIENT SURVEYS:  FOTO 43% function FOTO: 65% (01/02/23)  COGNITION: Overall cognitive status: Within functional limits for tasks assessed     SENSATION: WFL    POSTURE: No Significant postural limitations  PALPATION: TTP grossly in R knee, mostly hamstrings  LOWER EXTREMITY ROM:  Active ROM Right eval Left eval  12/13/22 12/20/22 12/26/22 12/28/22 01/04/23  Hip flexion  Hip extension          Hip abduction          Hip adduction          Hip internal rotation          Hip external rotation          Knee flexion 93 121 106 106 112 116 116 123  Knee extension Lacking 4 0  Lacking 5 Lacking 9 Lacking 7 Lacking 5 Improves to lacking 2 Lacking 7  Ankle dorsiflexion          Ankle plantarflexion          Ankle inversion          Ankle eversion           (Blank rows = not tested) *= pain/symptoms  LOWER EXTREMITY MMT:  MMT Right eval Left eval   Hip flexion    Hip extension    Hip abduction    Hip adduction    Hip internal rotation    Hip external rotation    Knee flexion 4 5  Knee extension 3- 5  Ankle dorsiflexion    Ankle plantarflexion    Ankle inversion    Ankle eversion     (Blank rows = not tested) *= pain/symptoms    FUNCTIONAL TESTS:  5 times sit to stand: 32.09 seconds with UE use, relies LLE  GAIT: Distance walked: 100 feet Assistive device utilized: Walker - 2 wheeled Level of assistance: Modified independence Comments: RW, antalgic on R   TODAY'S TREATMENT:                                                                                                                              DATE:  11/18 Manual: extension ROM with overpressure, patellar mobilizations,trigger point release to gastroc and posterior knee.   SLR 3x10  SAQ 3x10   Standing:   Step up 4 inch 2x10  Lateral step up 2x10 4 inch     01/04/2023 Recumbent bike 5 minutes level 3 full revolutions for dynamic warm up  Manual: extension ROM with overpressure, patellar mobilizations, retrograde edema massage Prone contract relax into extension 2 x 10 x 5 second holds Lateral step ups: 4 inch 2x12   01/02/23 Recumbent bike 5 minutes full revolutions for dynamic warm up  SLR 1x10 Manual: extension ROM with overpressure, patellar mobilizations TKE: GTB 10 x 10 second holds STS with GTB at knees 1 x 10 Step up R LE: 4 inch 2 x 10  Standing hip abduction GTB at knees 1 x 10  Backwards walking 4x laps of 25 ft Lateral step ups: 4 inch 2x12    12/28/22 Recumbent bike 5 minutes full revolutions for dynamic warm up  SLR 1x10 Manual: extension ROM with overpressure, patellar mobilizations TKE GTB 10 x 10 second holds STS with  GTB at knees 1 x 10 Step up 4 inch 2 x 10  Standing  hip abduction GTB at knees 1 x 10   12/26/22 Recumbent bike 5 minutes rocking for dynamic warm up  SLR 1x10 Prone knee hang 3 minutes Prone quad set 10 x  10 second holds Manual: extension ROM with overpressure, patellar mobilizations Supine active hamstring stretch 10 x 10 second holds Contract relax into extensions 10 x 5 second holds Single leg stance 3 way reach 5 x 5 second holds     12/20/22 Recumbent bike 5 minutes rocking for dynamic warm up  Quad set 5 x 10 SLR 1x10  TKE GTB 10 x 10 second holds Retrograde edema massage  10 minutes Mini squat 1 x 10 LAQ 10 x 5 second holds  12/13/22 Recumbent bike 5 minutes rocking for dynamic warm up  Heel slides 5 x 10 second holds SLR 3x10  SAQ 2x10 with 5 second holds TKE GTB 10 x 10 second holds HR 1 x 20 TR 1 x 20 Mini squat 2 x 10 Standing hip abduction 2 x 10 bilateral    10/17 Quad sets x15 SLR 2x10  SAQ 2x10   Heel raise 2x10  Standing slow march 2x10      11/30/22 Ankle pumps 1 x 10 Quad sets 10 x 10 second holds Heel slides 10 x 10 second holds HS isometric 10 x 10 second holds SAQ 10x 5 second holds Heel prop and elevation    PATIENT EDUCATION:  Education details: Patient educated on exam findings, POC, scope of PT, HEP, and knee positioning at rest, elevation. Person educated: Patient Education method: Explanation, Demonstration, and Handouts Education comprehension: verbalized understanding, returned demonstration, verbal cues required, and tactile cues required  HOME EXERCISE PROGRAM: Access Code: [email or web address] URL: [email or web address] Date: 11/30/2022 - Seated Ankle Pumps on Table  - 5 x daily - 7 x weekly - 1 sets - 20 reps - Supine Quadricep Sets  - 5 x daily - 7 x weekly - 10 reps - 10 second hold - Supine Heel Slide with Strap (Mirrored)  - 5 x daily - 7 x weekly - 10 reps - 10 second hold - Supine Isometric Hamstring Set  - 5 x daily - 7 x weekly - 10 reps - 10 second hold - Supine Knee Extension Strengthening (Mirrored)  - 5 x daily - 7 x weekly - 10 reps - 5 second hold  12/13/22 - Standing Terminal Knee Extension with  Resistance  - 1 x daily - 7 x weekly - 2 sets - 10 reps - 10 second hold - Standing Hip Abduction with Counter Support  - 1 x daily - 7 x weekly - 2 sets - 10 reps - Mini Squat with Counter Support  - 1 x daily - 7 x weekly - 2 sets - 10 reps  12/20/22- Seated Long Arc Quad  - 1 x daily - 7 x weekly - 1-2 sets - 10 reps 12/28/22 - Sit to Stand with Arms Crossed  - 1 x daily - 7 x weekly - 3 sets - 10 reps  ASSESSMENT:  CLINICAL IMPRESSION: The patient has maintained her extension. She is asking good progress. She had no significant increase in pain with exercises. She had minor soreness coming in. We will continue t progress as tolerated.   Eval:Patient a 72 y.o. y.o. female who was seen today for physical therapy evaluation and treatment for S/P right total knee arthroplasty 11/27/22. Patient presents with pain limited deficits in R knee strength, ROM, endurance, activity tolerance, gait, balance, and functional mobility  with ADL. Patient is having to modify and restrict ADL as indicated by outcome measure score as well as subjective information and objective measures which is affecting overall participation. Patient will benefit from skilled physical therapy in order to improve function and reduce impairment.  OBJECTIVE IMPAIRMENTS: Abnormal gait, decreased activity tolerance, decreased balance, decreased endurance, decreased mobility, difficulty walking, decreased ROM, decreased strength, improper body mechanics, and pain.   ACTIVITY LIMITATIONS: carrying, lifting, bending, standing, squatting, sleeping, stairs, transfers, toileting, dressing, hygiene/grooming, locomotion level, and caring for others  PARTICIPATION LIMITATIONS: meal prep, cleaning, laundry, driving, shopping, community activity, and yard work  PERSONAL FACTORS: Time since onset of injury/illness/exacerbation are also affecting patient's functional outcome.   REHAB POTENTIAL: Good  CLINICAL DECISION MAKING:  Stable/uncomplicated  EVALUATION COMPLEXITY: Low   GOALS: Goals reviewed with patient? Yes  SHORT TERM GOALS: Target date: 12/28/2022    Patient will be independent with HEP in order to improve functional outcomes. Baseline: Goal status: INITIAL  2.  Patient will report at least 25% improvement in symptoms for improved quality of life. Baseline: Goal status: INITIAL    LONG TERM GOALS: Target date: 01/25/2023    Patient will report at least 75% improvement in symptoms for improved quality of life. Baseline:  Goal status: INITIAL  2.  Patient will improve FOTO score by at least 32 points in order to indicate improved tolerance to activity. Baseline: 43% function Goal status: INITIAL  3.  Patient will be able to navigate stairs with reciprocal pattern without compensation in order to demonstrate improved LE strength. Baseline:  Goal status: INITIAL  4.  Patient will improve ROM for R knee extension/flexion to 0-120 degrees to improve squatting, and other functional mobility. Baseline: lacking 4 to 93 Goal status: INITIAL  5.  Patient will be able to complete 5x STS in under 11.4 seconds in order to reduce the risk of falls. Baseline:  Goal status: INITIAL  6.  Patient will demonstrate grade of 5/5 MMT grade in all tested musculature as evidence of improved strength to assist with stair ambulation and gait.   Baseline:  Goal status: INITIAL   PLAN:  PT FREQUENCY: 1-2x/week  PT DURATION: 8 weeks  PLANNED INTERVENTIONS: Therapeutic exercises, Therapeutic activity, Neuromuscular re-education, Balance training, Gait training, Patient/Family education, Joint manipulation, Joint mobilization, Stair training, Orthotic/Fit training, DME instructions, Aquatic Therapy, Dry Needling, Electrical stimulation, Spinal manipulation, Spinal mobilization, Cryotherapy, Moist heat, Compression bandaging, scar mobilization, Splintting, Taping, Traction, Ultrasound, Ionotophoresis 4mg /ml  Dexamethasone, and Manual therapy  PLAN FOR NEXT SESSION: Continue L knee mobility and strength and progress as able   Dessie Coma, PT   01/08/2023, 9:10 AM   Referring diagnosis? M17.11 (ICD-10-CM) - Unilateral primary osteoarthritis, right knee Treatment diagnosis? (if different than referring diagnosis) M25.561 What was this (referring dx) caused by? [x]  Surgery []  Fall []  Ongoing issue []  Arthritis []  Other: ____________  Laterality: [x]  Rt []  Lt []  Both  Check all possible CPT codes:  *CHOOSE 10 OR LESS*    [x]  97110 (Therapeutic Exercise)  []  92507 (SLP Treatment)  [x]  97112 (Neuro Re-ed)   []  92526 (Swallowing Treatment)   [x]  97116 (Gait Training)   []  [id number] (Cognitive Training, 1st 15 minutes) [x]  97140 (Manual Therapy)   []  97130 (Cognitive Training, each add'l 15 minutes)  [x]  97164 (Re-evaluation)                              []  Other,  List CPT Code ____________  [x]  97530 (Therapeutic Activities)     [x]  97535 (Self Care)   [x]  All codes above (97110 - 97535)  []  97012 (Mechanical Traction)  []  97014 (E-stim Unattended)  []  78295 (E-stim manual)  []  97033 (Ionto)  []  97035 (Ultrasound) []  97750 (Physical Performance Training) [x]  [id number] (Aquatic Therapy) []  97016 (Vasopneumatic Device) []  [id number] (Paraffin) []  97034 (Contrast Bath) []  97597 (Wound Care 1st 20 sq cm) []  97598 (Wound Care each add'l 20 sq cm) []  97760 (Orthotic Fabrication, Fitting, Training Initial) []  [id number] (Prosthetic Management and Training Initial) []  [id number] (Orthotic or Prosthetic Training/ Modification Subsequent)

## 2023-01-08 ENCOUNTER — Encounter (HOSPITAL_BASED_OUTPATIENT_CLINIC_OR_DEPARTMENT_OTHER): Payer: Self-pay | Admitting: Physical Therapy

## 2023-01-10 ENCOUNTER — Ambulatory Visit (HOSPITAL_BASED_OUTPATIENT_CLINIC_OR_DEPARTMENT_OTHER): Payer: Medicare PPO | Admitting: Physical Therapy

## 2023-01-10 ENCOUNTER — Encounter (HOSPITAL_BASED_OUTPATIENT_CLINIC_OR_DEPARTMENT_OTHER): Payer: Self-pay | Admitting: Physical Therapy

## 2023-01-10 DIAGNOSIS — R2689 Other abnormalities of gait and mobility: Secondary | ICD-10-CM | POA: Diagnosis not present

## 2023-01-10 DIAGNOSIS — M6281 Muscle weakness (generalized): Secondary | ICD-10-CM

## 2023-01-10 DIAGNOSIS — R29898 Other symptoms and signs involving the musculoskeletal system: Secondary | ICD-10-CM

## 2023-01-10 DIAGNOSIS — M25561 Pain in right knee: Secondary | ICD-10-CM | POA: Diagnosis not present

## 2023-01-10 DIAGNOSIS — M25661 Stiffness of right knee, not elsewhere classified: Secondary | ICD-10-CM | POA: Diagnosis not present

## 2023-01-10 NOTE — Therapy (Addendum)
OUTPATIENT PHYSICAL THERAPY LOWER EXTREMITY TREATMENT   Patient Name: Christie Beasley MRN: [id number] DOB:[birth year], 72 y.o., female Today's Date: 01/10/2023  Progress Note   Reporting Period 11/30/22 to 01/10/23   See note below for Objective Data and Assessment of Progress/Goals   END OF SESSION:  PT End of Session - 01/10/23 1346     Visit Number 10    Number of Visits 32    Date for PT Re-Evaluation 03/07/23    Authorization Type [employer]    Progress Note Due on Visit 20    PT Start Time 1346    PT Stop Time 1427    PT Time Calculation (min) 41 min    Activity Tolerance Patient tolerated treatment well;No increased pain    Behavior During Therapy WFL for tasks assessed/performed                Past Medical History:  Diagnosis Date   Acid reflux    Arthritis    Elevated lipids    Fibroid    History of fainting    pt reports she is a "fainter".     PONV (postoperative nausea and vomiting)    Rosacea    Thrombophlebitis 11/2000   Thyroid nodule 09/2002   Past Surgical History:  Procedure Laterality Date   BREAST EXCISIONAL BIOPSY Left    BREAST EXCISIONAL BIOPSY Right    CESAREAN SECTION     x 2   DILATION AND CURETTAGE OF UTERUS  1994   FOOT SURGERY Bilateral    hammer toes   HYSTEROSCOPY  01/11/2012   D&C   KNEE SURGERY Left 2012   MEDIAL PARTIAL KNEE REPLACEMENT Left 03/10/2015   THYROID LOBECTOMY Right 02/2003   TONSILLECTOMY AND ADENOIDECTOMY     TOTAL KNEE ARTHROPLASTY Right 11/27/2022   Procedure: RIGHT TOTAL KNEE ARTHROPLASTY;  Surgeon: Durene Romans, MD;  Location: [facility];  Service: Orthopedics;  Laterality: Right;   Patient Active Problem List   Diagnosis Date Noted   S/P total knee arthroplasty, right 11/27/2022   Educated about COVID-19 virus infection 07/07/2019   Palpitations 07/07/2019   Pain in right knee 07/11/2017   History of prosthetic unicompartmental arthroplasty of left knee 03/10/2015   Post-menopausal  bleeding 11/12/2012   Vaginal atrophy 11/12/2012    PCP: Garth Bigness MD  REFERRING PROVIDER: Durene Romans, MD  REFERRING DIAG: M17.11 (ICD-10-CM) - Unilateral primary osteoarthritis, right knee; S/P right total knee arthroplasty 11/27/22  THERAPY DIAG:  Muscle weakness (generalized) - Plan: PT plan of care cert/re-cert  Stiffness of right knee, not elsewhere classified - Plan: PT plan of care cert/re-cert  Other abnormalities of gait and mobility - Plan: PT plan of care cert/re-cert  Right knee pain, unspecified chronicity - Plan: PT plan of care cert/re-cert  Other symptoms and signs involving the musculoskeletal system - Plan: PT plan of care cert/re-cert  Rationale for Evaluation and Treatment: Rehabilitation  ONSET DATE: DOS 11/27/22  SUBJECTIVE:   SUBJECTIVE STATEMENT: Patient states her knee is doing alright. Her R foot and ankle are sore. HEP going well. Patient states 70-80% improvement. Still limited by pain at night. Doing well with shopping, housework.   PERTINENT HISTORY: R TKA PAIN:  Are you having pain? Yes: NPRS scale: 1-2/10 Pain location: R knee Pain description: achy, stabbing Aggravating factors: bending, walking Relieving factors: meds  PRECAUTIONS: None  WEIGHT BEARING RESTRICTIONS: No  FALLS:  Has patient fallen in last 6 months? No   PLOF: Independent  PATIENT GOALS:  walk normally, stairs with good mechanics   OBJECTIVE: (objective measures from initial evaluation unless otherwise dated)  PATIENT SURVEYS:  FOTO 43% function FOTO: 65% (01/02/23) 01/10/23: 67% function   COGNITION: Overall cognitive status: Within functional limits for tasks assessed     SENSATION: WFL    POSTURE: No Significant postural limitations  PALPATION: TTP grossly in R knee, mostly hamstrings  LOWER EXTREMITY ROM:  Active ROM Right eval Left eval  12/13/22 12/20/22 12/26/22 12/28/22 01/04/23 01/10/23  Hip flexion           Hip extension            Hip abduction           Hip adduction           Hip internal rotation           Hip external rotation           Knee flexion 93 121 106 106 112 116 116 123 123  Knee extension Lacking 4 0  Lacking 5 Lacking 9 Lacking 7 Lacking 5 Improves to lacking 2 Lacking 7 Lacking 5  Ankle dorsiflexion           Ankle plantarflexion           Ankle inversion           Ankle eversion            (Blank rows = not tested) *= pain/symptoms  LOWER EXTREMITY MMT:  MMT Right eval Left eval R 01/10/23 L 01/10/23  Hip flexion      Hip extension      Hip abduction      Hip adduction      Hip internal rotation      Hip external rotation      Knee flexion 4 5 4+ 5  Knee extension 3- 5 4+ 5  Ankle dorsiflexion      Ankle plantarflexion      Ankle inversion      Ankle eversion       (Blank rows = not tested) *= pain/symptoms    FUNCTIONAL TESTS:  5 times sit to stand: 32.09 seconds with UE use, relies LLE  GAIT: Distance walked: 100 feet Assistive device utilized: Environmental consultant - 2 wheeled Level of assistance: Modified independence Comments: RW, antalgic on R   Reassessment 01/10/23: 5xSTS: 17.45 seconds without UE use, dynamic knee valgus bilaterally Stairs: 7 inch alternating, increased effort required on RLE ascending, decreased eccentric control mildly on RLE and R dynamic valgus decent  TODAY'S TREATMENT:                                                                                                                              DATE:  01/10/23 Recumbent bike 5 minutes level 3 full revolutions for dynamic warm up  Reassessment  Lateral step down 4 inch 2 x 10  11/18 Manual: extension ROM with overpressure, patellar mobilizations,trigger point release  to gastroc and posterior knee.   SLR 3x10  SAQ 3x10   Standing:   Step up 4 inch 2x10  Lateral step up 2x10 4 inch     01/04/2023 Recumbent bike 5 minutes level 3 full revolutions for dynamic warm up  Manual: extension  ROM with overpressure, patellar mobilizations, retrograde edema massage Prone contract relax into extension 2 x 10 x 5 second holds Lateral step ups: 4 inch 2x12   01/02/23 Recumbent bike 5 minutes full revolutions for dynamic warm up  SLR 1x10 Manual: extension ROM with overpressure, patellar mobilizations TKE: GTB 10 x 10 second holds STS with GTB at knees 1 x 10 Step up R LE: 4 inch 2 x 10  Standing hip abduction GTB at knees 1 x 10  Backwards walking 4x laps of 25 ft Lateral step ups: 4 inch 2x12    12/28/22 Recumbent bike 5 minutes full revolutions for dynamic warm up  SLR 1x10 Manual: extension ROM with overpressure, patellar mobilizations TKE GTB 10 x 10 second holds STS with  GTB at knees 1 x 10 Step up 4 inch 2 x 10  Standing hip abduction GTB at knees 1 x 10   12/26/22 Recumbent bike 5 minutes rocking for dynamic warm up  SLR 1x10 Prone knee hang 3 minutes Prone quad set 10 x 10 second holds Manual: extension ROM with overpressure, patellar mobilizations Supine active hamstring stretch 10 x 10 second holds Contract relax into extensions 10 x 5 second holds Single leg stance 3 way reach 5 x 5 second holds     12/20/22 Recumbent bike 5 minutes rocking for dynamic warm up  Quad set 5 x 10 SLR 1x10  TKE GTB 10 x 10 second holds Retrograde edema massage  10 minutes Mini squat 1 x 10 LAQ 10 x 5 second holds  12/13/22 Recumbent bike 5 minutes rocking for dynamic warm up  Heel slides 5 x 10 second holds SLR 3x10  SAQ 2x10 with 5 second holds TKE GTB 10 x 10 second holds HR 1 x 20 TR 1 x 20 Mini squat 2 x 10 Standing hip abduction 2 x 10 bilateral    10/17 Quad sets x15 SLR 2x10  SAQ 2x10   Heel raise 2x10  Standing slow march 2x10      11/30/22 Ankle pumps 1 x 10 Quad sets 10 x 10 second holds Heel slides 10 x 10 second holds HS isometric 10 x 10 second holds SAQ 10x 5 second holds Heel prop and elevation    PATIENT EDUCATION:  Education  details: Patient educated on exam findings, POC, scope of PT, HEP, and knee positioning at rest, elevation. Person educated: Patient Education method: Explanation, Demonstration, and Handouts Education comprehension: verbalized understanding, returned demonstration, verbal cues required, and tactile cues required  HOME EXERCISE PROGRAM: Access Code: [email or web address] URL: [email or web address] Date: 11/30/2022 - Seated Ankle Pumps on Table  - 5 x daily - 7 x weekly - 1 sets - 20 reps - Supine Quadricep Sets  - 5 x daily - 7 x weekly - 10 reps - 10 second hold - Supine Heel Slide with Strap (Mirrored)  - 5 x daily - 7 x weekly - 10 reps - 10 second hold - Supine Isometric Hamstring Set  - 5 x daily - 7 x weekly - 10 reps - 10 second hold - Supine Knee Extension Strengthening (Mirrored)  - 5 x daily - 7 x weekly - 10 reps - 5 second hold  12/13/22 -  Standing Terminal Knee Extension with Resistance  - 1 x daily - 7 x weekly - 2 sets - 10 reps - 10 second hold - Standing Hip Abduction with Counter Support  - 1 x daily - 7 x weekly - 2 sets - 10 reps - Mini Squat with Counter Support  - 1 x daily - 7 x weekly - 2 sets - 10 reps  12/20/22- Seated Long Arc Quad  - 1 x daily - 7 x weekly - 1-2 sets - 10 reps 12/28/22 - Sit to Stand with Arms Crossed  - 1 x daily - 7 x weekly - 3 sets - 10 reps  ASSESSMENT:  CLINICAL IMPRESSION: Patient has met 2/2 short term goals and 0/6 long term goals with ability to complete HEP and improvement in symptoms. Remaining goals not met due to continued deficits in symptoms, strength, ROM, activity tolerance, gait, and functional mobility although great progress has been made toward remaining goals. Patient progressing very well.  Patient will continue to benefit from skilled physical therapy in order to improve function and reduce impairment.    Eval:Patient a 72 y.o. y.o. female who was seen today for physical therapy evaluation and treatment for S/P right  total knee arthroplasty 11/27/22. Patient presents with pain limited deficits in R knee strength, ROM, endurance, activity tolerance, gait, balance, and functional mobility with ADL. Patient is having to modify and restrict ADL as indicated by outcome measure score as well as subjective information and objective measures which is affecting overall participation. Patient will benefit from skilled physical therapy in order to improve function and reduce impairment.  OBJECTIVE IMPAIRMENTS: Abnormal gait, decreased activity tolerance, decreased balance, decreased endurance, decreased mobility, difficulty walking, decreased ROM, decreased strength, improper body mechanics, and pain.   ACTIVITY LIMITATIONS: carrying, lifting, bending, standing, squatting, sleeping, stairs, transfers, toileting, dressing, hygiene/grooming, locomotion level, and caring for others  PARTICIPATION LIMITATIONS: meal prep, cleaning, laundry, driving, shopping, community activity, and yard work  PERSONAL FACTORS: Time since onset of injury/illness/exacerbation are also affecting patient's functional outcome.   REHAB POTENTIAL: Good  CLINICAL DECISION MAKING: Stable/uncomplicated  EVALUATION COMPLEXITY: Low   GOALS: Goals reviewed with patient? Yes  SHORT TERM GOALS: Target date: 12/28/2022    Patient will be independent with HEP in order to improve functional outcomes. Baseline: Goal status: MET  2.  Patient will report at least 25% improvement in symptoms for improved quality of life. Baseline: Goal status: MET    LONG TERM GOALS: Target date: 01/25/2023    Patient will report at least 75% improvement in symptoms for improved quality of life. Baseline:  Goal status: INITIAL  2.  Patient will improve FOTO score by at least 32 points in order to indicate improved tolerance to activity. Baseline: 43% function 01/10/23 67% function Goal status: INITIAL  3.  Patient will be able to navigate stairs with  reciprocal pattern without compensation in order to demonstrate improved LE strength. Baseline:  Goal status: INITIAL  4.  Patient will improve ROM for R knee extension/flexion to 0-120 degrees to improve squatting, and other functional mobility. Baseline: lacking 4 to 93 01/10/23: lacking 5 to 123 Goal status: INITIAL  5.  Patient will be able to complete 5x STS in under 11.4 seconds in order to reduce the risk of falls. Baseline:  Goal status: INITIAL  6.  Patient will demonstrate grade of 5/5 MMT grade in all tested musculature as evidence of improved strength to assist with stair ambulation and gait.  Baseline:  Goal status: INITIAL   PLAN:  PT FREQUENCY: 1-2x/week  PT DURATION: 8 weeks  PLANNED INTERVENTIONS: Therapeutic exercises, Therapeutic activity, Neuromuscular re-education, Balance training, Gait training, Patient/Family education, Joint manipulation, Joint mobilization, Stair training, Orthotic/Fit training, DME instructions, Aquatic Therapy, Dry Needling, Electrical stimulation, Spinal manipulation, Spinal mobilization, Cryotherapy, Moist heat, Compression bandaging, scar mobilization, Splintting, Taping, Traction, Ultrasound, Ionotophoresis 4mg /ml Dexamethasone, and Manual therapy  PLAN FOR NEXT SESSION: Continue L knee mobility and strength and progress as able   Wyman Songster, PT   01/10/2023, 2:33 PM   Referring diagnosis? M17.11 (ICD-10-CM) - Unilateral primary osteoarthritis, right knee Treatment diagnosis? (if different than referring diagnosis) M25.561 What was this (referring dx) caused by? [x]  Surgery []  Fall []  Ongoing issue []  Arthritis []  Other: ____________  Laterality: [x]  Rt []  Lt []  Both  Check all possible CPT codes:  *CHOOSE 10 OR LESS*    [x]  97110 (Therapeutic Exercise)  []  92507 (SLP Treatment)  [x]  97112 (Neuro Re-ed)   []  92526 (Swallowing Treatment)   [x]  97116 (Gait Training)   []  [id number] (Cognitive Training, 1st 15  minutes) [x]  97140 (Manual Therapy)   []  97130 (Cognitive Training, each add'l 15 minutes)  [x]  97164 (Re-evaluation)                              []  Other, List CPT Code ____________  [x]  97530 (Therapeutic Activities)     [x]  97535 (Self Care)   [x]  All codes above (97110 - 97535)  []  97012 (Mechanical Traction)  []  97014 (E-stim Unattended)  []  97032 (E-stim manual)  []  97033 (Ionto)  []  97035 (Ultrasound) []  97750 (Physical Performance Training) [x]  [id number] (Aquatic Therapy) []  97016 (Vasopneumatic Device) []  82956 (Paraffin) []  97034 (Contrast Bath) []  97597 (Wound Care 1st 20 sq cm) []  97598 (Wound Care each add'l 20 sq cm) []  97760 (Orthotic Fabrication, Fitting, Training Initial) []  [id number] (Prosthetic Management and Training Initial) []  [id number] (Orthotic or Prosthetic Training/ Modification Subsequent)

## 2023-01-14 DIAGNOSIS — Z96651 Presence of right artificial knee joint: Secondary | ICD-10-CM | POA: Diagnosis not present

## 2023-01-14 DIAGNOSIS — Z471 Aftercare following joint replacement surgery: Secondary | ICD-10-CM | POA: Diagnosis not present

## 2023-01-15 ENCOUNTER — Ambulatory Visit (HOSPITAL_BASED_OUTPATIENT_CLINIC_OR_DEPARTMENT_OTHER): Payer: Medicare PPO | Admitting: Physical Therapy

## 2023-01-15 ENCOUNTER — Encounter (HOSPITAL_BASED_OUTPATIENT_CLINIC_OR_DEPARTMENT_OTHER): Payer: Self-pay | Admitting: Physical Therapy

## 2023-01-15 DIAGNOSIS — M6281 Muscle weakness (generalized): Secondary | ICD-10-CM | POA: Diagnosis not present

## 2023-01-15 DIAGNOSIS — M25561 Pain in right knee: Secondary | ICD-10-CM

## 2023-01-15 DIAGNOSIS — M25661 Stiffness of right knee, not elsewhere classified: Secondary | ICD-10-CM

## 2023-01-15 DIAGNOSIS — R29898 Other symptoms and signs involving the musculoskeletal system: Secondary | ICD-10-CM | POA: Diagnosis not present

## 2023-01-15 DIAGNOSIS — R2689 Other abnormalities of gait and mobility: Secondary | ICD-10-CM | POA: Diagnosis not present

## 2023-01-15 NOTE — Therapy (Signed)
OUTPATIENT PHYSICAL THERAPY LOWER EXTREMITY TREATMENT   Patient Name: Christie Beasley MRN: [id number] DOB:11-30-1950, 72 y.o., female Today's Date: 01/15/2023  Progress Note   Reporting Period 11/30/22 to 01/10/23   See note below for Objective Data and Assessment of Progress/Goals   END OF SESSION:  PT End of Session - 01/15/23 1301     Visit Number 11    Number of Visits 32    Date for PT Re-Evaluation 03/07/23    Authorization Type [employer]    Progress Note Due on Visit 20    PT Start Time 1301    PT Stop Time 1341    PT Time Calculation (min) 40 min    Activity Tolerance Patient tolerated treatment well;No increased pain    Behavior During Therapy WFL for tasks assessed/performed                Past Medical History:  Diagnosis Date   Acid reflux    Arthritis    Elevated lipids    Fibroid    History of fainting    pt reports she is a "fainter".     PONV (postoperative nausea and vomiting)    Rosacea    Thrombophlebitis 11/2000   Thyroid nodule 09/2002   Past Surgical History:  Procedure Laterality Date   BREAST EXCISIONAL BIOPSY Left    BREAST EXCISIONAL BIOPSY Right    CESAREAN SECTION     x 2   DILATION AND CURETTAGE OF UTERUS  1994   FOOT SURGERY Bilateral    hammer toes   HYSTEROSCOPY  01/11/2012   D&C   KNEE SURGERY Left 2012   MEDIAL PARTIAL KNEE REPLACEMENT Left 03/10/2015   THYROID LOBECTOMY Right 02/2003   TONSILLECTOMY AND ADENOIDECTOMY     TOTAL KNEE ARTHROPLASTY Right 11/27/2022   Procedure: RIGHT TOTAL KNEE ARTHROPLASTY;  Surgeon: Durene Romans, MD;  Location: [facility];  Service: Orthopedics;  Laterality: Right;   Patient Active Problem List   Diagnosis Date Noted   S/P total knee arthroplasty, right 11/27/2022   Educated about COVID-19 virus infection 07/07/2019   Palpitations 07/07/2019   Pain in right knee 07/11/2017   History of prosthetic unicompartmental arthroplasty of left knee 03/10/2015   Post-menopausal  bleeding 11/12/2012   Vaginal atrophy 11/12/2012    PCP: Garth Bigness MD  REFERRING PROVIDER: Durene Romans, MD  REFERRING DIAG: M17.11 (ICD-10-CM) - Unilateral primary osteoarthritis, right knee; S/P right total knee arthroplasty 11/27/22  THERAPY DIAG:  Right knee pain, unspecified chronicity  Other abnormalities of gait and mobility  Stiffness of right knee, not elsewhere classified  Muscle weakness (generalized)  Rationale for Evaluation and Treatment: Rehabilitation  ONSET DATE: DOS 11/27/22  SUBJECTIVE:   SUBJECTIVE STATEMENT: Patient states she may be ready to transition to HEP. Wants to review gym machines today.   PERTINENT HISTORY: R TKA PAIN:  Are you having pain? Yes: NPRS scale: 1/10 Pain location: R knee Pain description: achy, stabbing Aggravating factors: bending, walking Relieving factors: meds  PRECAUTIONS: None  WEIGHT BEARING RESTRICTIONS: No  FALLS:  Has patient fallen in last 6 months? No   PLOF: Independent  PATIENT GOALS: walk normally, stairs with good mechanics   OBJECTIVE: (objective measures from initial evaluation unless otherwise dated)  PATIENT SURVEYS:  FOTO 43% function FOTO: 65% (01/02/23) 01/10/23: 67% function   COGNITION: Overall cognitive status: Within functional limits for tasks assessed     SENSATION: WFL    POSTURE: No Significant postural limitations  PALPATION: TTP grossly  in R knee, mostly hamstrings  LOWER EXTREMITY ROM:  Active ROM Right eval Left eval  12/13/22 12/20/22 12/26/22 12/28/22 01/04/23 01/10/23  Hip flexion           Hip extension           Hip abduction           Hip adduction           Hip internal rotation           Hip external rotation           Knee flexion 93 121 106 106 112 116 116 123 123  Knee extension Lacking 4 0  Lacking 5 Lacking 9 Lacking 7 Lacking 5 Improves to lacking 2 Lacking 7 Lacking 5  Ankle dorsiflexion           Ankle plantarflexion            Ankle inversion           Ankle eversion            (Blank rows = not tested) *= pain/symptoms  LOWER EXTREMITY MMT:  MMT Right eval Left eval R 01/10/23 L 01/10/23  Hip flexion      Hip extension      Hip abduction      Hip adduction      Hip internal rotation      Hip external rotation      Knee flexion 4 5 4+ 5  Knee extension 3- 5 4+ 5  Ankle dorsiflexion      Ankle plantarflexion      Ankle inversion      Ankle eversion       (Blank rows = not tested) *= pain/symptoms    FUNCTIONAL TESTS:  5 times sit to stand: 32.09 seconds with UE use, relies LLE  GAIT: Distance walked: 100 feet Assistive device utilized: Environmental consultant - 2 wheeled Level of assistance: Modified independence Comments: RW, antalgic on R   Reassessment 01/10/23: 5xSTS: 17.45 seconds without UE use, dynamic knee valgus bilaterally Stairs: 7 inch alternating, increased effort required on RLE ascending, decreased eccentric control mildly on RLE and R dynamic valgus decent  TODAY'S TREATMENT:                                                                                                                              DATE:  01/15/23 Recumbent bike 5 minutes level 3 full revolutions for dynamic warm up  Lateral step down 4 inch 2 x 10 Lunge 1 x 5 Cybex leg press 30 # 2 x 10  Cybex knee extension 15# 2 x 10  LF HS curl machine 25# 2 x 10 Hip abduction machine 30# 2 x 10 Hip adduction machine 25 # 2 x 10  01/10/23 Recumbent bike 5 minutes level 3 full revolutions for dynamic warm up  Reassessment  Lateral step down 4 inch 2 x 10  11/18 Manual: extension  ROM with overpressure, patellar mobilizations,trigger point release to gastroc and posterior knee.   SLR 3x10  SAQ 3x10   Standing:   Step up 4 inch 2x10  Lateral step up 2x10 4 inch     01/04/2023 Recumbent bike 5 minutes level 3 full revolutions for dynamic warm up  Manual: extension ROM with overpressure, patellar mobilizations,  retrograde edema massage Prone contract relax into extension 2 x 10 x 5 second holds Lateral step ups: 4 inch 2x12   01/02/23 Recumbent bike 5 minutes full revolutions for dynamic warm up  SLR 1x10 Manual: extension ROM with overpressure, patellar mobilizations TKE: GTB 10 x 10 second holds STS with GTB at knees 1 x 10 Step up R LE: 4 inch 2 x 10  Standing hip abduction GTB at knees 1 x 10  Backwards walking 4x laps of 25 ft Lateral step ups: 4 inch 2x12    12/28/22 Recumbent bike 5 minutes full revolutions for dynamic warm up  SLR 1x10 Manual: extension ROM with overpressure, patellar mobilizations TKE GTB 10 x 10 second holds STS with  GTB at knees 1 x 10 Step up 4 inch 2 x 10  Standing hip abduction GTB at knees 1 x 10   12/26/22 Recumbent bike 5 minutes rocking for dynamic warm up  SLR 1x10 Prone knee hang 3 minutes Prone quad set 10 x 10 second holds Manual: extension ROM with overpressure, patellar mobilizations Supine active hamstring stretch 10 x 10 second holds Contract relax into extensions 10 x 5 second holds Single leg stance 3 way reach 5 x 5 second holds     12/20/22 Recumbent bike 5 minutes rocking for dynamic warm up  Quad set 5 x 10 SLR 1x10  TKE GTB 10 x 10 second holds Retrograde edema massage  10 minutes Mini squat 1 x 10 LAQ 10 x 5 second holds  12/13/22 Recumbent bike 5 minutes rocking for dynamic warm up  Heel slides 5 x 10 second holds SLR 3x10  SAQ 2x10 with 5 second holds TKE GTB 10 x 10 second holds HR 1 x 20 TR 1 x 20 Mini squat 2 x 10 Standing hip abduction 2 x 10 bilateral    10/17 Quad sets x15 SLR 2x10  SAQ 2x10   Heel raise 2x10  Standing slow march 2x10      11/30/22 Ankle pumps 1 x 10 Quad sets 10 x 10 second holds Heel slides 10 x 10 second holds HS isometric 10 x 10 second holds SAQ 10x 5 second holds Heel prop and elevation    PATIENT EDUCATION:  Education details: Patient educated on exam findings,  POC, scope of PT, HEP, and knee positioning at rest, elevation. Person educated: Patient Education method: Explanation, Demonstration, and Handouts Education comprehension: verbalized understanding, returned demonstration, verbal cues required, and tactile cues required  HOME EXERCISE PROGRAM: Access Code: [email or web address] URL: [email or web address] Date: 11/30/2022 - Seated Ankle Pumps on Table  - 5 x daily - 7 x weekly - 1 sets - 20 reps - Supine Quadricep Sets  - 5 x daily - 7 x weekly - 10 reps - 10 second hold - Supine Heel Slide with Strap (Mirrored)  - 5 x daily - 7 x weekly - 10 reps - 10 second hold - Supine Isometric Hamstring Set  - 5 x daily - 7 x weekly - 10 reps - 10 second hold - Supine Knee Extension Strengthening (Mirrored)  - 5 x daily - 7 x weekly - 10  reps - 5 second hold  12/13/22 - Standing Terminal Knee Extension with Resistance  - 1 x daily - 7 x weekly - 2 sets - 10 reps - 10 second hold - Standing Hip Abduction with Counter Support  - 1 x daily - 7 x weekly - 2 sets - 10 reps - Mini Squat with Counter Support  - 1 x daily - 7 x weekly - 2 sets - 10 reps  12/20/22- Seated Long Arc Quad  - 1 x daily - 7 x weekly - 1-2 sets - 10 reps 12/28/22 - Sit to Stand with Arms Crossed  - 1 x daily - 7 x weekly - 3 sets - 10 reps 01/15/23 - Lateral Step Down  - 1 x daily - 7 x weekly - 2 sets - 10 reps - Lunge with Counter Support (Mirrored)  - 1 x daily - 7 x weekly - 2 sets - 10 reps  ASSESSMENT:  CLINICAL IMPRESSION: Patient progressing well. Review of gym machines today and functional strength. Patient lacking eccentric strength and control with functional strengthening. Discussed POC and will discuss d/c vs. Continued POC at next session. Patient will continue to benefit from skilled physical therapy in order to improve function and reduce impairment.    Eval:Patient a 72 y.o. y.o. female who was seen today for physical therapy evaluation and treatment for S/P  right total knee arthroplasty 11/27/22. Patient presents with pain limited deficits in R knee strength, ROM, endurance, activity tolerance, gait, balance, and functional mobility with ADL. Patient is having to modify and restrict ADL as indicated by outcome measure score as well as subjective information and objective measures which is affecting overall participation. Patient will benefit from skilled physical therapy in order to improve function and reduce impairment.  OBJECTIVE IMPAIRMENTS: Abnormal gait, decreased activity tolerance, decreased balance, decreased endurance, decreased mobility, difficulty walking, decreased ROM, decreased strength, improper body mechanics, and pain.   ACTIVITY LIMITATIONS: carrying, lifting, bending, standing, squatting, sleeping, stairs, transfers, toileting, dressing, hygiene/grooming, locomotion level, and caring for others  PARTICIPATION LIMITATIONS: meal prep, cleaning, laundry, driving, shopping, community activity, and yard work  PERSONAL FACTORS: Time since onset of injury/illness/exacerbation are also affecting patient's functional outcome.   REHAB POTENTIAL: Good  CLINICAL DECISION MAKING: Stable/uncomplicated  EVALUATION COMPLEXITY: Low   GOALS: Goals reviewed with patient? Yes  SHORT TERM GOALS: Target date: 12/28/2022    Patient will be independent with HEP in order to improve functional outcomes. Baseline: Goal status: MET  2.  Patient will report at least 25% improvement in symptoms for improved quality of life. Baseline: Goal status: MET    LONG TERM GOALS: Target date: 01/25/2023    Patient will report at least 75% improvement in symptoms for improved quality of life. Baseline:  Goal status: INITIAL  2.  Patient will improve FOTO score by at least 32 points in order to indicate improved tolerance to activity. Baseline: 43% function 01/10/23 67% function Goal status: INITIAL  3.  Patient will be able to navigate stairs with  reciprocal pattern without compensation in order to demonstrate improved LE strength. Baseline:  Goal status: INITIAL  4.  Patient will improve ROM for R knee extension/flexion to 0-120 degrees to improve squatting, and other functional mobility. Baseline: lacking 4 to 93 01/10/23: lacking 5 to 123 Goal status: INITIAL  5.  Patient will be able to complete 5x STS in under 11.4 seconds in order to reduce the risk of falls. Baseline:  Goal status: INITIAL  6.  Patient will demonstrate grade of 5/5 MMT grade in all tested musculature as evidence of improved strength to assist with stair ambulation and gait.   Baseline:  Goal status: INITIAL   PLAN:  PT FREQUENCY: 1-2x/week  PT DURATION: 8 weeks  PLANNED INTERVENTIONS: Therapeutic exercises, Therapeutic activity, Neuromuscular re-education, Balance training, Gait training, Patient/Family education, Joint manipulation, Joint mobilization, Stair training, Orthotic/Fit training, DME instructions, Aquatic Therapy, Dry Needling, Electrical stimulation, Spinal manipulation, Spinal mobilization, Cryotherapy, Moist heat, Compression bandaging, scar mobilization, Splintting, Taping, Traction, Ultrasound, Ionotophoresis 4mg /ml Dexamethasone, and Manual therapy  PLAN FOR NEXT SESSION: Continue L knee mobility and strength and progress as able   Wyman Songster, PT   01/15/2023, 1:02 PM   Referring diagnosis? M17.11 (ICD-10-CM) - Unilateral primary osteoarthritis, right knee Treatment diagnosis? (if different than referring diagnosis) M25.561 What was this (referring dx) caused by? [x]  Surgery []  Fall []  Ongoing issue []  Arthritis []  Other: ____________  Laterality: [x]  Rt []  Lt []  Both  Check all possible CPT codes:  *CHOOSE 10 OR LESS*    [x]  97110 (Therapeutic Exercise)  []  16109 (SLP Treatment)  [x]  97112 (Neuro Re-ed)   []  92526 (Swallowing Treatment)   [x]  97116 (Gait Training)   []  [id number] (Cognitive Training, 1st 15  minutes) [x]  97140 (Manual Therapy)   []  97130 (Cognitive Training, each add'l 15 minutes)  [x]  97164 (Re-evaluation)                              []  Other, List CPT Code ____________  [x]  97530 (Therapeutic Activities)     [x]  97535 (Self Care)   [x]  All codes above (97110 - 97535)  []  97012 (Mechanical Traction)  []  97014 (E-stim Unattended)  []  97032 (E-stim manual)  []  97033 (Ionto)  []  97035 (Ultrasound) []  97750 (Physical Performance Training) [x]  [id number] (Aquatic Therapy) []  97016 (Vasopneumatic Device) []  [id number] (Paraffin) []  97034 (Contrast Bath) []  97597 (Wound Care 1st 20 sq cm) []  97598 (Wound Care each add'l 20 sq cm) []  97760 (Orthotic Fabrication, Fitting, Training Initial) []  [id number] (Prosthetic Management and Training Initial) []  [id number] (Orthotic or Prosthetic Training/ Modification Subsequent)

## 2023-01-22 ENCOUNTER — Ambulatory Visit (HOSPITAL_BASED_OUTPATIENT_CLINIC_OR_DEPARTMENT_OTHER): Payer: Medicare PPO | Attending: Orthopedic Surgery | Admitting: Physical Therapy

## 2023-01-22 ENCOUNTER — Encounter (HOSPITAL_BASED_OUTPATIENT_CLINIC_OR_DEPARTMENT_OTHER): Payer: Self-pay | Admitting: Physical Therapy

## 2023-01-22 DIAGNOSIS — M25661 Stiffness of right knee, not elsewhere classified: Secondary | ICD-10-CM | POA: Insufficient documentation

## 2023-01-22 DIAGNOSIS — M25561 Pain in right knee: Secondary | ICD-10-CM | POA: Diagnosis not present

## 2023-01-22 DIAGNOSIS — M6281 Muscle weakness (generalized): Secondary | ICD-10-CM | POA: Insufficient documentation

## 2023-01-22 DIAGNOSIS — R29898 Other symptoms and signs involving the musculoskeletal system: Secondary | ICD-10-CM | POA: Diagnosis not present

## 2023-01-22 DIAGNOSIS — R2689 Other abnormalities of gait and mobility: Secondary | ICD-10-CM | POA: Diagnosis not present

## 2023-01-22 NOTE — Therapy (Signed)
OUTPATIENT PHYSICAL THERAPY LOWER EXTREMITY TREATMENT   Patient Name: Christie Beasley MRN: [id number] DOB:1950/05/13, 72 y.o., female Today's Date: 01/22/2023  PHYSICAL THERAPY DISCHARGE SUMMARY  Visits from Start of Care: 12  Current functional level related to goals / functional outcomes: See below   Remaining deficits: See below   Education / Equipment: See below   Patient agrees to discharge. Patient goals were partially met. Patient is being discharged due to being pleased with the current functional level.   END OF SESSION:  PT End of Session - 01/22/23 0937     Visit Number 12    Number of Visits 32    Date for PT Re-Evaluation 03/07/23    Authorization Type [employer]    Progress Note Due on Visit 20    PT Start Time 0935    PT Stop Time 1012    PT Time Calculation (min) 37 min    Activity Tolerance Patient tolerated treatment well;No increased pain    Behavior During Therapy WFL for tasks assessed/performed                Past Medical History:  Diagnosis Date   Acid reflux    Arthritis    Elevated lipids    Fibroid    History of fainting    pt reports she is a "fainter".     PONV (postoperative nausea and vomiting)    Rosacea    Thrombophlebitis 11/2000   Thyroid nodule 09/2002   Past Surgical History:  Procedure Laterality Date   BREAST EXCISIONAL BIOPSY Left    BREAST EXCISIONAL BIOPSY Right    CESAREAN SECTION     x 2   DILATION AND CURETTAGE OF UTERUS  1994   FOOT SURGERY Bilateral    hammer toes   HYSTEROSCOPY  01/11/2012   D&C   KNEE SURGERY Left 2012   MEDIAL PARTIAL KNEE REPLACEMENT Left 03/10/2015   THYROID LOBECTOMY Right 02/2003   TONSILLECTOMY AND ADENOIDECTOMY     TOTAL KNEE ARTHROPLASTY Right 11/27/2022   Procedure: RIGHT TOTAL KNEE ARTHROPLASTY;  Surgeon: Durene Romans, MD;  Location: [facility];  Service: Orthopedics;  Laterality: Right;   Patient Active Problem List   Diagnosis Date Noted   S/P total knee  arthroplasty, right 11/27/2022   Educated about COVID-19 virus infection 07/07/2019   Palpitations 07/07/2019   Pain in right knee 07/11/2017   History of prosthetic unicompartmental arthroplasty of left knee 03/10/2015   Post-menopausal bleeding 11/12/2012   Vaginal atrophy 11/12/2012    PCP: Garth Bigness MD  REFERRING PROVIDER: Durene Romans, MD  REFERRING DIAG: M17.11 (ICD-10-CM) - Unilateral primary osteoarthritis, right knee; S/P right total knee arthroplasty 11/27/22  THERAPY DIAG:  Right knee pain, unspecified chronicity  Other abnormalities of gait and mobility  Stiffness of right knee, not elsewhere classified  Muscle weakness (generalized)  Other symptoms and signs involving the musculoskeletal system  Rationale for Evaluation and Treatment: Rehabilitation  ONSET DATE: DOS 11/27/22  SUBJECTIVE:   SUBJECTIVE STATEMENT: Patient states wants to transition to HEP and wants to give it a shot on her own. Feels like she is doing pretty well. Off medication. Doing HEP regularly besides the last few days with the holidays. Swelling has gone down. Patient states 90% improvement in symptoms/function. Not really limited with things at this point, doing anything she wants to do. Lacking endurance in knee still.   PERTINENT HISTORY: R TKA PAIN:  Are you having pain? Yes: NPRS scale: 2/10 Pain location: R  knee Pain description: achy, stabbing Aggravating factors: bending, walking Relieving factors: meds  PRECAUTIONS: None  WEIGHT BEARING RESTRICTIONS: No  FALLS:  Has patient fallen in last 6 months? No   PLOF: Independent  PATIENT GOALS: walk normally, stairs with good mechanics   OBJECTIVE: (objective measures from initial evaluation unless otherwise dated)  PATIENT SURVEYS:  FOTO 43% function FOTO: 65% (01/02/23) 01/10/23: 67% function 01/22/23: 69% function  COGNITION: Overall cognitive status: Within functional limits for tasks  assessed     SENSATION: WFL    POSTURE: No Significant postural limitations  PALPATION: TTP grossly in R knee, mostly hamstrings  LOWER EXTREMITY ROM:  Active ROM Right eval Left eval  12/13/22 12/20/22 12/26/22 12/28/22 01/04/23 01/10/23 01/22/23  Hip flexion            Hip extension            Hip abduction            Hip adduction            Hip internal rotation            Hip external rotation            Knee flexion 93 121 106 106 112 116 116 123 123 123   Knee extension Lacking 4 0  Lacking 5 Lacking 9 Lacking 7 Lacking 5 Improves to lacking 2 Lacking 7 Lacking 5 Lacking 3  Ankle dorsiflexion            Ankle plantarflexion            Ankle inversion            Ankle eversion             (Blank rows = not tested) *= pain/symptoms  LOWER EXTREMITY MMT:  MMT Right eval Left eval R 01/10/23 L 01/10/23 Right 01/22/23 Left 01/22/23  Hip flexion        Hip extension        Hip abduction        Hip adduction        Hip internal rotation        Hip external rotation        Knee flexion 4 5 4+ 5 5 5   Knee extension 3- 5 4+ 5 5 5   Ankle dorsiflexion        Ankle plantarflexion        Ankle inversion        Ankle eversion         (Blank rows = not tested) *= pain/symptoms    FUNCTIONAL TESTS:  5 times sit to stand: 32.09 seconds with UE use, relies LLE  GAIT: Distance walked: 100 feet Assistive device utilized: Environmental consultant - 2 wheeled Level of assistance: Modified independence Comments: RW, antalgic on R   Reassessment 01/10/23: 5xSTS: 17.45 seconds without UE use, dynamic knee valgus bilaterally Stairs: 7 inch alternating, increased effort required on RLE ascending, decreased eccentric control mildly on RLE and R dynamic valgus decent   Reassessment 01/22/23: 5xSTS: 13.34 seconds without UE use, dynamic knee valgus bilaterally Stairs: 7 inch alternating, mild dynamic knee valgus bilaterally with ascending   TODAY'S TREATMENT:  DATE:  01/22/23 Recumbent bike 5 minutes level 3 full revolutions for dynamic warm up  Reassessment Review of HEP   01/15/23 Recumbent bike 5 minutes level 3 full revolutions for dynamic warm up  Lateral step down 4 inch 2 x 10 Lunge 1 x 5 Cybex leg press 30 # 2 x 10  Cybex knee extension 15# 2 x 10  LF HS curl machine 25# 2 x 10 Hip abduction machine 30# 2 x 10 Hip adduction machine 25 # 2 x 10  01/10/23 Recumbent bike 5 minutes level 3 full revolutions for dynamic warm up  Reassessment  Lateral step down 4 inch 2 x 10  11/18 Manual: extension ROM with overpressure, patellar mobilizations,trigger point release to gastroc and posterior knee.   SLR 3x10  SAQ 3x10   Standing:   Step up 4 inch 2x10  Lateral step up 2x10 4 inch     01/04/2023 Recumbent bike 5 minutes level 3 full revolutions for dynamic warm up  Manual: extension ROM with overpressure, patellar mobilizations, retrograde edema massage Prone contract relax into extension 2 x 10 x 5 second holds Lateral step ups: 4 inch 2x12   01/02/23 Recumbent bike 5 minutes full revolutions for dynamic warm up  SLR 1x10 Manual: extension ROM with overpressure, patellar mobilizations TKE: GTB 10 x 10 second holds STS with GTB at knees 1 x 10 Step up R LE: 4 inch 2 x 10  Standing hip abduction GTB at knees 1 x 10  Backwards walking 4x laps of 25 ft Lateral step ups: 4 inch 2x12    12/28/22 Recumbent bike 5 minutes full revolutions for dynamic warm up  SLR 1x10 Manual: extension ROM with overpressure, patellar mobilizations TKE GTB 10 x 10 second holds STS with  GTB at knees 1 x 10 Step up 4 inch 2 x 10  Standing hip abduction GTB at knees 1 x 10   12/26/22 Recumbent bike 5 minutes rocking for dynamic warm up  SLR 1x10 Prone knee hang 3 minutes Prone quad set 10 x 10 second holds Manual: extension ROM with  overpressure, patellar mobilizations Supine active hamstring stretch 10 x 10 second holds Contract relax into extensions 10 x 5 second holds Single leg stance 3 way reach 5 x 5 second holds     12/20/22 Recumbent bike 5 minutes rocking for dynamic warm up  Quad set 5 x 10 SLR 1x10  TKE GTB 10 x 10 second holds Retrograde edema massage  10 minutes Mini squat 1 x 10 LAQ 10 x 5 second holds  12/13/22 Recumbent bike 5 minutes rocking for dynamic warm up  Heel slides 5 x 10 second holds SLR 3x10  SAQ 2x10 with 5 second holds TKE GTB 10 x 10 second holds HR 1 x 20 TR 1 x 20 Mini squat 2 x 10 Standing hip abduction 2 x 10 bilateral    10/17 Quad sets x15 SLR 2x10  SAQ 2x10   Heel raise 2x10  Standing slow march 2x10      11/30/22 Ankle pumps 1 x 10 Quad sets 10 x 10 second holds Heel slides 10 x 10 second holds HS isometric 10 x 10 second holds SAQ 10x 5 second holds Heel prop and elevation    PATIENT EDUCATION:  Education details: Patient educated on exam findings, POC, scope of PT, HEP, and knee positioning at rest, elevation. Person educated: Patient Education method: Explanation, Demonstration, and Handouts Education comprehension: verbalized understanding, returned demonstration, verbal cues required, and  tactile cues required  HOME EXERCISE PROGRAM: Access Code: [email or web address] URL: [email or web address] Date: 11/30/2022 - Seated Ankle Pumps on Table  - 5 x daily - 7 x weekly - 1 sets - 20 reps - Supine Quadricep Sets  - 5 x daily - 7 x weekly - 10 reps - 10 second hold - Supine Heel Slide with Strap (Mirrored)  - 5 x daily - 7 x weekly - 10 reps - 10 second hold - Supine Isometric Hamstring Set  - 5 x daily - 7 x weekly - 10 reps - 10 second hold - Supine Knee Extension Strengthening (Mirrored)  - 5 x daily - 7 x weekly - 10 reps - 5 second hold  12/13/22 - Standing Terminal Knee Extension with Resistance  - 1 x daily - 7 x weekly - 2 sets - 10  reps - 10 second hold - Standing Hip Abduction with Counter Support  - 1 x daily - 7 x weekly - 2 sets - 10 reps - Mini Squat with Counter Support  - 1 x daily - 7 x weekly - 2 sets - 10 reps  12/20/22- Seated Long Arc Quad  - 1 x daily - 7 x weekly - 1-2 sets - 10 reps 12/28/22 - Sit to Stand with Arms Crossed  - 1 x daily - 7 x weekly - 3 sets - 10 reps 01/15/23 - Lateral Step Down  - 1 x daily - 7 x weekly - 2 sets - 10 reps - Lunge with Counter Support (Mirrored)  - 1 x daily - 7 x weekly - 2 sets - 10 reps  ASSESSMENT:  CLINICAL IMPRESSION: Patient has met 2/2 short term goals and 4/6 long term goals with ability to complete HEP and improvement in symptoms, strength, gait, activity tolerance, functional mobility, ROM. One partially met as listed below. Remaining goals not met due to continued deficits in endurance, functional strength, activity tolerance, end range extension ROM although great progress has been made toward remaining goals. Reviewed HEP with patient and progressions. Patient discharged from PT at this time.    Eval:Patient a 72 y.o. y.o. female who was seen today for physical therapy evaluation and treatment for S/P right total knee arthroplasty 11/27/22. Patient presents with pain limited deficits in R knee strength, ROM, endurance, activity tolerance, gait, balance, and functional mobility with ADL. Patient is having to modify and restrict ADL as indicated by outcome measure score as well as subjective information and objective measures which is affecting overall participation. Patient will benefit from skilled physical therapy in order to improve function and reduce impairment.  OBJECTIVE IMPAIRMENTS: Abnormal gait, decreased activity tolerance, decreased balance, decreased endurance, decreased mobility, difficulty walking, decreased ROM, decreased strength, improper body mechanics, and pain.   ACTIVITY LIMITATIONS: carrying, lifting, bending, standing, squatting, sleeping,  stairs, transfers, toileting, dressing, hygiene/grooming, locomotion level, and caring for others  PARTICIPATION LIMITATIONS: meal prep, cleaning, laundry, driving, shopping, community activity, and yard work  PERSONAL FACTORS: Time since onset of injury/illness/exacerbation are also affecting patient's functional outcome.   REHAB POTENTIAL: Good  CLINICAL DECISION MAKING: Stable/uncomplicated  EVALUATION COMPLEXITY: Low   GOALS: Goals reviewed with patient? Yes  SHORT TERM GOALS: Target date: 12/28/2022    Patient will be independent with HEP in order to improve functional outcomes. Baseline: Goal status: MET  2.  Patient will report at least 25% improvement in symptoms for improved quality of life. Baseline: Goal status: MET    LONG TERM GOALS: Target date: 01/25/2023  Patient will report at least 75% improvement in symptoms for improved quality of life. Baseline:  Goal status: MET  2.  Patient will improve FOTO score by at least 32 points in order to indicate improved tolerance to activity. Baseline: 43% function 01/10/23 67% function 01/22/23: 69% function  Goal status: NOT MET  3.  Patient will be able to navigate stairs with reciprocal pattern without compensation in order to demonstrate improved LE strength. Baseline:  Goal status: MET  4.  Patient will improve ROM for R knee extension/flexion to 0-120 degrees to improve squatting, and other functional mobility. Baseline: lacking 4 to 93 01/10/23: lacking 5 to 123 01/22/23: lacking 3 to 123 Goal status: partially met  5.  Patient will be able to complete 5x STS in under 11.4 seconds in order to reduce the risk of falls. Baseline:  Goal status: NOT MET  6.  Patient will demonstrate grade of 5/5 MMT grade in all tested musculature as evidence of improved strength to assist with stair ambulation and gait.   Baseline:  Goal status: MET   PLAN:  PT FREQUENCY: 1-2x/week  PT DURATION: 8 weeks  PLANNED  INTERVENTIONS: Therapeutic exercises, Therapeutic activity, Neuromuscular re-education, Balance training, Gait training, Patient/Family education, Joint manipulation, Joint mobilization, Stair training, Orthotic/Fit training, DME instructions, Aquatic Therapy, Dry Needling, Electrical stimulation, Spinal manipulation, Spinal mobilization, Cryotherapy, Moist heat, Compression bandaging, scar mobilization, Splintting, Taping, Traction, Ultrasound, Ionotophoresis 4mg /ml Dexamethasone, and Manual therapy  PLAN FOR NEXT SESSION: n/a   Wyman Songster, PT   01/22/2023, 10:13 AM   Referring diagnosis? M17.11 (ICD-10-CM) - Unilateral primary osteoarthritis, right knee Treatment diagnosis? (if different than referring diagnosis) M25.561 What was this (referring dx) caused by? [x]  Surgery []  Fall []  Ongoing issue []  Arthritis []  Other: ____________  Laterality: [x]  Rt []  Lt []  Both  Check all possible CPT codes:  *CHOOSE 10 OR LESS*    [x]  97110 (Therapeutic Exercise)  []  92507 (SLP Treatment)  [x]  97112 (Neuro Re-ed)   []  92526 (Swallowing Treatment)   [x]  97116 (Gait Training)   []  [id number] (Cognitive Training, 1st 15 minutes) [x]  97140 (Manual Therapy)   []  97130 (Cognitive Training, each add'l 15 minutes)  [x]  97164 (Re-evaluation)                              []  Other, List CPT Code ____________  [x]  97530 (Therapeutic Activities)     [x]  97535 (Self Care)   [x]  All codes above (97110 - 97535)  []  97012 (Mechanical Traction)  []  97014 (E-stim Unattended)  []  97032 (E-stim manual)  []  97033 (Ionto)  []  97035 (Ultrasound) []  40347 (Physical Performance Training) [x]  [id number] (Aquatic Therapy) []  97016 (Vasopneumatic Device) []  [id number] (Paraffin) []  97034 (Contrast Bath) []  97597 (Wound Care 1st 20 sq cm) []  97598 (Wound Care each add'l 20 sq cm) []  97760 (Orthotic Fabrication, Fitting, Training Initial) []  [id number] (Prosthetic Management and Training Initial) []  [id number] (Orthotic or  Prosthetic Training/ Modification Subsequent)

## 2023-01-28 ENCOUNTER — Other Ambulatory Visit (HOSPITAL_BASED_OUTPATIENT_CLINIC_OR_DEPARTMENT_OTHER): Payer: Self-pay

## 2023-01-28 MED ORDER — COVID-19 MRNA VAC-TRIS(PFIZER) 30 MCG/0.3ML IM SUSY
0.3000 mL | PREFILLED_SYRINGE | Freq: Once | INTRAMUSCULAR | 0 refills | Status: AC
  Filled 2023-01-28: qty 0.3, 1d supply, fill #0

## 2023-01-29 ENCOUNTER — Encounter (HOSPITAL_BASED_OUTPATIENT_CLINIC_OR_DEPARTMENT_OTHER): Payer: Medicare PPO | Admitting: Physical Therapy

## 2023-02-05 ENCOUNTER — Encounter (HOSPITAL_BASED_OUTPATIENT_CLINIC_OR_DEPARTMENT_OTHER): Payer: Medicare PPO | Admitting: Physical Therapy

## 2023-02-08 ENCOUNTER — Encounter (HOSPITAL_BASED_OUTPATIENT_CLINIC_OR_DEPARTMENT_OTHER): Payer: Medicare PPO | Admitting: Physical Therapy

## 2023-02-19 ENCOUNTER — Encounter (HOSPITAL_BASED_OUTPATIENT_CLINIC_OR_DEPARTMENT_OTHER): Payer: Medicare PPO | Admitting: Physical Therapy

## 2023-02-21 DIAGNOSIS — E039 Hypothyroidism, unspecified: Secondary | ICD-10-CM | POA: Diagnosis not present

## 2023-02-21 DIAGNOSIS — F5101 Primary insomnia: Secondary | ICD-10-CM | POA: Diagnosis not present

## 2023-02-21 DIAGNOSIS — R350 Frequency of micturition: Secondary | ICD-10-CM | POA: Diagnosis not present

## 2023-02-21 DIAGNOSIS — J309 Allergic rhinitis, unspecified: Secondary | ICD-10-CM | POA: Diagnosis not present

## 2023-02-21 DIAGNOSIS — E559 Vitamin D deficiency, unspecified: Secondary | ICD-10-CM | POA: Diagnosis not present

## 2023-02-21 DIAGNOSIS — Z0000 Encounter for general adult medical examination without abnormal findings: Secondary | ICD-10-CM | POA: Diagnosis not present

## 2023-02-21 DIAGNOSIS — Z79899 Other long term (current) drug therapy: Secondary | ICD-10-CM | POA: Diagnosis not present

## 2023-02-21 DIAGNOSIS — E7800 Pure hypercholesterolemia, unspecified: Secondary | ICD-10-CM | POA: Diagnosis not present

## 2023-02-21 DIAGNOSIS — R413 Other amnesia: Secondary | ICD-10-CM | POA: Diagnosis not present

## 2023-02-21 DIAGNOSIS — R7301 Impaired fasting glucose: Secondary | ICD-10-CM | POA: Diagnosis not present

## 2023-02-21 DIAGNOSIS — M810 Age-related osteoporosis without current pathological fracture: Secondary | ICD-10-CM | POA: Diagnosis not present

## 2023-02-22 ENCOUNTER — Encounter (HOSPITAL_BASED_OUTPATIENT_CLINIC_OR_DEPARTMENT_OTHER): Payer: Medicare PPO | Admitting: Physical Therapy

## 2023-02-26 ENCOUNTER — Encounter (HOSPITAL_BASED_OUTPATIENT_CLINIC_OR_DEPARTMENT_OTHER): Payer: Medicare PPO | Admitting: Physical Therapy

## 2023-03-01 ENCOUNTER — Encounter (HOSPITAL_BASED_OUTPATIENT_CLINIC_OR_DEPARTMENT_OTHER): Payer: Medicare PPO | Admitting: Physical Therapy

## 2023-03-04 DIAGNOSIS — M47812 Spondylosis without myelopathy or radiculopathy, cervical region: Secondary | ICD-10-CM | POA: Diagnosis not present

## 2023-03-04 DIAGNOSIS — M2242 Chondromalacia patellae, left knee: Secondary | ICD-10-CM | POA: Diagnosis not present

## 2023-03-04 DIAGNOSIS — M5030 Other cervical disc degeneration, unspecified cervical region: Secondary | ICD-10-CM | POA: Diagnosis not present

## 2023-03-04 DIAGNOSIS — Z471 Aftercare following joint replacement surgery: Secondary | ICD-10-CM | POA: Diagnosis not present

## 2023-04-24 DIAGNOSIS — F5101 Primary insomnia: Secondary | ICD-10-CM | POA: Diagnosis not present

## 2023-04-24 DIAGNOSIS — R413 Other amnesia: Secondary | ICD-10-CM | POA: Diagnosis not present

## 2023-04-30 ENCOUNTER — Encounter: Payer: Self-pay | Admitting: Physician Assistant

## 2023-05-07 DIAGNOSIS — K219 Gastro-esophageal reflux disease without esophagitis: Secondary | ICD-10-CM | POA: Diagnosis not present

## 2023-05-07 DIAGNOSIS — Z1211 Encounter for screening for malignant neoplasm of colon: Secondary | ICD-10-CM | POA: Diagnosis not present

## 2023-05-07 DIAGNOSIS — Z800 Family history of malignant neoplasm of digestive organs: Secondary | ICD-10-CM | POA: Diagnosis not present

## 2023-05-29 DIAGNOSIS — E7800 Pure hypercholesterolemia, unspecified: Secondary | ICD-10-CM | POA: Diagnosis not present

## 2023-06-04 NOTE — Progress Notes (Signed)
 Assessment/Plan:     Christie Beasley is a very pleasant 73 y.o. year old RH female with a history of hypertension, hyperlipidemia, arthritis, GERD, rosacea, vit D deficiency, hypothyroidism, PAD, insomnia  seen today for evaluation of memory loss. MoCA today is .  Workup is in progress.    Memory Impairment  MRI brain without contrast to assess for underlying structural abnormality and assess vascular load  Neurocognitive testing to further evaluate cognitive concerns and determine other underlying cause of memory changes, including potential contribution from sleep, anxiety, attention, or depression among others  Check B12, TSH Recommend good control of cardiovascular risk factors.   Continue to control mood as per PCP Folllow up in   Subjective:    The patient is accompanied by ***  who supplements the history.    How long did patient have memory difficulties?  For about 4 years.  Patient reports some difficulty remembering new information, recent conversations, names of people that she knows for a long time, retrieving the right words. repeats oneself?  Endorsed Disoriented when walking into a room?  Patient denies ***  Leaving objects in unusual places?  denies   Wandering behavior? denies   Any personality changes, or depression, anxiety? denies*** Hallucinations or paranoia? denies   Seizures? denies    Any sleep changes?  Sleeps well ***/does not sleep well. ***vivid dreams, REM behavior or sleepwalking   Sleep apnea? Denies.   Any hygiene concerns?  Denies.   Independent of bathing and dressing? Endorsed  Does the patient need help with medications?  is in charge *** Who is in charge of the finances?  is in charge   *** Any changes in appetite?   Denies. ***   Patient have trouble swallowing?  Denies.   Does the patient cook? No*** Any headaches?  Denies.   Chronic pain? Denies.   Ambulates with difficulty? Had a recent R knee surgery, doing PT. She goes to  [employer] classes as well.  Recent falls or head injuries? Denies.     Vision changes?  Denies any new issues.  Has a history of*** Any strokelike symptoms? Denies. She has a history of fainting "I am a fainter"-she says   Any tremors? Denies. *** Any anosmia? Denies.   Any incontinence of urine? Denies.   Any bowel dysfunction? Denies.      Patient lives with ***  History of heavy alcohol intake? Denies.   History of heavy tobacco use? Denies.   Family history of dementia?   Mother with dementia  Does patient drive? No *** Recent labs 02/2023: TC 270, LDL 192, A1C 5.7, vit D 44, TSH 1.06, B12 524.   Allergies  Allergen Reactions   Lipitor  [Atorvastatin Calcium] Other (See Comments)    Ache    Current Outpatient Medications  Medication Instructions   acetaminophen (TYLENOL) 1,300 mg, Oral, Every 8 hours PRN   amoxicillin (AMOXIL) 500 mg, Oral, As needed, One hour prior to dental work    celecoxib (CELEBREX) 200 mg, Oral, Every other day   CoQ10 100 mg, Oral, Daily   COVID-19 mRNA bivalent vaccine, Moderna, (MODERNA COVID-19 BIVAL BOOSTER) 50 MCG/0.5ML injection Intramuscular   COVID-19 mRNA vaccine 2023-2024 (COMIRNATY) syringe Intramuscular   GLUCOSAMINE-CHONDROITIN PO 1,500 mg, Oral, Daily, Dose: 1500mg (glucosamine)-1200mg (chondroitin)   melatonin 5-10 mg, Oral, Daily at bedtime, Chewable tab    methocarbamol (ROBAXIN) 500 mg, Oral, Every 6 hours PRN   Multiple Vitamins-Minerals (MULTIVITAMIN PO) 1 tablet, Oral, Daily, Taking by mouth  at bedtime.   omeprazole (PRILOSEC) 40 mg, Oral, Daily   oxyCODONE (OXY IR/ROXICODONE) 5 mg, Oral, Every 4 hours PRN   polyethylene glycol (MIRALAX / GLYCOLAX) 17 g, Oral, 2 times daily   Synthroid 75 mcg, Oral, Daily before breakfast   TURMERIC PO 2,250 mg, Oral, Daily, + curcumin   Vitamin D, Ergocalciferol, (DRISDOL) 50000 units CAPS capsule Take by mouth. Every 3 weeks     VITALS:  There were no vitals filed for this visit.    PHYSICAL  EXAM   HEENT:  Normocephalic, atraumatic.  The superficial temporal arteries are without ropiness or tenderness. Cardiovascular: Regular rate and rhythm. Lungs: Clear to auscultation bilaterally. Neck: There are no carotid bruits noted bilaterally.  NEUROLOGICAL:     No data to display              No data to display           Orientation:  Alert and oriented to person, place and not to time***. No aphasia or dysarthria. Fund of knowledge is appropriate. Recent and remote memory impaired.  Attention and concentration are reduced***.  Able to name objects and repeat phrases /5 ***. Delayed recall  / *** Cranial nerves: There is good facial symmetry. Extraocular muscles are intact and visual fields are full to confrontational testing. Speech is fluent and clear. No tongue deviation. Hearing is intact to conversational tone.*** Tone: Tone is good throughout. Sensation: Sensation is intact to light touch.  Vibration is intact at the bilateral big toe.  Coordination: The patient has no difficulty with RAM's or FNF bilaterally. Normal finger to nose  Motor: Strength is 5/5 in the bilateral upper and lower extremities. There is no pronator drift. There are no fasciculations noted. DTR's: Deep tendon reflexes are 2/4 bilaterally. Gait and Station: The patient is able to ambulate without difficulty. Gait is cautious and narrow. Stride length is normal ***      Thank you for allowing us  the opportunity to participate in the care of this nice patient. Please do not hesitate to contact us  for any questions or concerns.   Total time spent on today's visit was *** minutes dedicated to this patient today, preparing to see patient, examining the patient, ordering tests and/or medications and counseling the patient, documenting clinical information in the EHR or other health record, independently interpreting results and communicating results to the patient/family, discussing treatment and goals,  answering patient's questions and coordinating care.  Cc:  Ransom Byers, MD  [name] 06/04/2023 12:27 PM

## 2023-06-05 ENCOUNTER — Ambulatory Visit

## 2023-06-05 ENCOUNTER — Encounter: Payer: Self-pay | Admitting: Physician Assistant

## 2023-06-05 ENCOUNTER — Ambulatory Visit: Admitting: Physician Assistant

## 2023-06-05 VITALS — BP 130/70 | HR 78 | Resp 20 | Wt 166.0 lb

## 2023-06-05 DIAGNOSIS — R413 Other amnesia: Secondary | ICD-10-CM | POA: Diagnosis not present

## 2023-06-05 NOTE — Patient Instructions (Addendum)
 It was a pleasure to see you today at our office.   Recommendations:  Neurocognitive evaluation at our office   MRI of the brain, the radiology office will call you to arrange you appointment  [phone] Follow up in 2 months    For psychiatric meds, mood meds: Please have your primary care physician manage these medications.  If you have any severe symptoms of a stroke, or other severe issues such as confusion,severe chills or fever, etc call 911 or go to the ER as you may need to be evaluated further   For guidance regarding [facility] and if placement were needed at the facility, contact Social Worker tel: [phone]  For assessment of decision of mental capacity and competency:  Call Dr. Erick Blinks, geriatric psychiatrist at [phone]  Counseling regarding caregiver distress, including caregiver depression, anxiety and issues regarding community resources, adult day care programs, adult living facilities, or memory care questions:  please contact your  Primary Doctor's Social Worker   Whom to call: Memory  decline, memory medications: Call our office [phone]    [email or web address]   RECOMMENDATIONS FOR ALL PATIENTS WITH MEMORY PROBLEMS: 1. Continue to exercise (Recommend 30 minutes of walking everyday, or 3 hours every week) 2. Increase social interactions - continue going to [place] and enjoy social gatherings with friends and family 3. Eat healthy, avoid fried foods and eat more fruits and vegetables 4. Maintain adequate blood pressure, blood sugar, and blood cholesterol level. Reducing the risk of stroke and cardiovascular disease also helps promoting better memory. 5. Avoid stressful situations. Live a simple life and avoid aggravations. Organize your time and prepare for the next day in anticipation. 6. Sleep well, avoid any interruptions of sleep and avoid any distractions in the bedroom  that may interfere with adequate sleep quality 7. Avoid sugar, avoid sweets as there is a strong link between excessive sugar intake, diabetes, and cognitive impairment We discussed the Mediterranean diet, which has been shown to help patients reduce the risk of progressive memory disorders and reduces cardiovascular risk. This includes eating fish, eat fruits and green leafy vegetables, nuts like almonds and hazelnuts, walnuts, and also use olive oil. Avoid fast foods and fried foods as much as possible. Avoid sweets and sugar as sugar use has been linked to worsening of memory function.  There is always a concern of gradual progression of memory problems. If this is the case, then we may need to adjust level of care according to patient needs. Support, both to the patient and caregiver, should then be put into place.      You have been referred for a neuropsychological evaluation (i.e., evaluation of memory and thinking abilities). Please bring someone with you to this appointment if possible, as it is helpful for the doctor to hear from both you and another adult who knows you well. Please bring eyeglasses and hearing aids if you wear them.    The evaluation will take approximately 3 hours and has two parts:   The first part is a clinical interview with the neuropsychologist (Dr. Milbert Coulter or Dr. Robbie Lis). During the interview, the neuropsychologist will speak with you and the individual you brought to the appointment.    The second part of the evaluation is testing with the doctor's technician Annabelle Harman or Selena Batten). During the testing, the technician will ask you to remember different types of material, solve problems, and answer some questionnaires. Your family member will not be present for this portion of the  evaluation.   Please note: We must reserve several hours of the neuropsychologist's time and the psychometrician's time for your evaluation appointment. As such, there is a No-Show fee of $100. If you  are unable to attend any of your appointments, please contact our office as soon as possible to reschedule.      DRIVING: Regarding driving, in patients with progressive memory problems, driving will be impaired. We advise to have someone else do the driving if trouble finding directions or if minor accidents are reported. Independent driving assessment is available to determine safety of driving.   If you are interested in the driving assessment, you can contact the following:  [facility] in [place] [phone]  [facility] [phone]  [facility] [phone]  [facility] [phone] or [phone]   FALL PRECAUTIONS: Be cautious when walking. Scan the area for obstacles that may increase the risk of trips and falls. When getting up in the mornings, sit up at the edge of the bed for a few minutes before getting out of bed. Consider elevating the bed at the head end to avoid drop of blood pressure when getting up. Walk always in a well-lit room (use night lights in the walls). Avoid area rugs or power cords from appliances in the middle of the walkways. Use a walker or a cane if necessary and consider physical therapy for balance exercise. Get your eyesight checked regularly.  FINANCIAL OVERSIGHT: Supervision, especially oversight when making financial decisions or transactions is also recommended.  HOME SAFETY: Consider the safety of the kitchen when operating appliances like stoves, microwave oven, and blender. Consider having supervision and share cooking responsibilities until no longer able to participate in those. Accidents with firearms and other hazards in the house should be identified and addressed as well.   ABILITY TO BE LEFT ALONE: If patient is unable to contact 911 operator, consider using LifeLine, or when the need is there, arrange for someone to stay with patients. Smoking is a fire hazard, consider supervision or  cessation. Risk of wandering should be assessed by caregiver and if detected at any point, supervision and safe proof recommendations should be instituted.  MEDICATION SUPERVISION: Inability to self-administer medication needs to be constantly addressed. Implement a mechanism to ensure safe administration of the medications.      Mediterranean Diet A Mediterranean diet refers to food and lifestyle choices that are based on the traditions of countries located on the [place]. This way of eating has been shown to help prevent certain conditions and improve outcomes for people who have chronic diseases, like kidney disease and heart disease. What are tips for following this plan? Lifestyle  Cook and eat meals together with your family, when possible. Drink enough fluid to keep your urine clear or pale yellow. Be physically active every day. This includes: Aerobic exercise like running or swimming. Leisure activities like gardening, walking, or housework. Get 7-8 hours of sleep each night. If recommended by your health care provider, drink red wine in moderation. This means 1 glass a day for nonpregnant women and 2 glasses a day for men. A glass of wine equals 5 oz (150 mL). Reading food labels  Check the serving size of packaged foods. For foods such as rice and pasta, the serving size refers to the amount of cooked product, not dry. Check the total fat in packaged foods. Avoid foods that have saturated fat or trans fats. Check the ingredients list for added sugars, such as  corn syrup. Shopping  At the grocery store, buy most of your food from the areas near the walls of the store. This includes: Fresh fruits and vegetables (produce). Grains, beans, nuts, and seeds. Some of these may be available in unpackaged forms or large amounts (in bulk). Fresh seafood. Poultry and eggs. Low-fat dairy products. Buy whole ingredients instead of prepackaged foods. Buy fresh fruits and  vegetables in-season from local farmers markets. Buy frozen fruits and vegetables in resealable bags. If you do not have access to quality fresh seafood, buy precooked frozen shrimp or canned fish, such as tuna, salmon, or sardines. Buy small amounts of raw or cooked vegetables, salads, or olives from the deli or salad bar at your store. Stock your pantry so you always have certain foods on hand, such as olive oil, canned tuna, canned tomatoes, rice, pasta, and beans. Cooking  Cook foods with extra-virgin olive oil instead of using butter or other vegetable oils. Have meat as a side dish, and have vegetables or grains as your main dish. This means having meat in small portions or adding small amounts of meat to foods like pasta or stew. Use beans or vegetables instead of meat in common dishes like chili or lasagna. Experiment with different cooking methods. Try roasting or broiling vegetables instead of steaming or sauteing them. Add frozen vegetables to soups, stews, pasta, or rice. Add nuts or seeds for added healthy fat at each meal. You can add these to yogurt, salads, or vegetable dishes. Marinate fish or vegetables using olive oil, lemon juice, garlic, and fresh herbs. Meal planning  Plan to eat 1 vegetarian meal one day each week. Try to work up to 2 vegetarian meals, if possible. Eat seafood 2 or more times a week. Have healthy snacks readily available, such as: Vegetable sticks with hummus. Greek yogurt. Fruit and nut trail mix. Eat balanced meals throughout the week. This includes: Fruit: 2-3 servings a day Vegetables: 4-5 servings a day Low-fat dairy: 2 servings a day Fish, poultry, or lean meat: 1 serving a day Beans and legumes: 2 or more servings a week Nuts and seeds: 1-2 servings a day Whole grains: 6-8 servings a day Extra-virgin olive oil: 3-4 servings a day Limit red meat and sweets to only a few servings a month What are my food choices? Mediterranean  diet Recommended Grains: Whole-grain pasta. Brown rice. Bulgar wheat. Polenta. Couscous. Whole-wheat bread. Dwyane Glad. Vegetables: Artichokes. Beets. Broccoli. Cabbage. Carrots. Eggplant. Green beans. Chard. Kale. Spinach. Onions. Leeks. Peas. Squash. Tomatoes. Peppers. Radishes. Fruits: Apples. Apricots. Avocado. Berries. Bananas. Cherries. Dates. Figs. Grapes. Lemons. Melon. Oranges. Peaches. Plums. Pomegranate. Meats and other protein foods: Beans. Almonds. Sunflower seeds. Pine nuts. Peanuts. Cod. Salmon. Scallops. Shrimp. Tuna. Tilapia. Clams. Oysters. Eggs. Dairy: Low-fat milk. Cheese. Greek yogurt. Beverages: Water. Red wine. Herbal tea. Fats and oils: Extra virgin olive oil. Avocado oil. Grape seed oil. Sweets and desserts: [place] yogurt with honey. Baked apples. Poached pears. Trail mix. Seasoning and other foods: Basil. Cilantro. Coriander. Cumin. Mint. Parsley. Sage. Rosemary. Tarragon. Garlic. Oregano. Thyme. Pepper. Balsalmic vinegar. Tahini. Hummus. Tomato sauce. Olives. Mushrooms. Limit these Grains: Prepackaged pasta or rice dishes. Prepackaged cereal with added sugar. Vegetables: Deep fried potatoes (french fries). Fruits: Fruit canned in syrup. Meats and other protein foods: Beef. Pork. Lamb. Poultry with skin. Hot dogs. Helene Loader. Dairy: Ice cream. Sour cream. Whole milk. Beverages: Juice. Sugar-sweetened soft drinks. Beer. Liquor and spirits. Fats and oils: Butter. Canola oil. Vegetable oil. Beef fat (tallow). Lard.  Sweets and desserts: Cookies. Cakes. Pies. Candy. Seasoning and other foods: Mayonnaise. Premade sauces and marinades. The items listed may not be a complete list. Talk with your dietitian about what dietary choices are right for you. Summary The Mediterranean diet includes both food and lifestyle choices. Eat a variety of fresh fruits and vegetables, beans, nuts, seeds, and whole grains. Limit the amount of red meat and sweets that you eat. Talk with your  health care provider about whether it is safe for you to drink red wine in moderation. This means 1 glass a day for nonpregnant women and 2 glasses a day for men. A glass of wine equals 5 oz (150 mL). This information is not intended to replace advice given to you by your health care provider. Make sure you discuss any questions you have with your health care provider. Document Released: 09/29/2015 Document Revised: 11/01/2015 Document Reviewed: 09/29/2015 Elsevier Interactive Patient Education  2017 [employer].

## 2023-06-14 ENCOUNTER — Encounter: Payer: Self-pay | Admitting: Neurology

## 2023-06-17 ENCOUNTER — Ambulatory Visit
Admission: RE | Admit: 2023-06-17 | Discharge: 2023-06-17 | Disposition: A | Discharge status: Home / Self Care | Source: Ambulatory Visit | Attending: Physician Assistant | Admitting: Physician Assistant

## 2023-06-17 DIAGNOSIS — R413 Other amnesia: Secondary | ICD-10-CM | POA: Diagnosis not present

## 2023-06-19 DIAGNOSIS — Z1211 Encounter for screening for malignant neoplasm of colon: Secondary | ICD-10-CM | POA: Diagnosis not present

## 2023-06-19 DIAGNOSIS — K5520 Angiodysplasia of colon without hemorrhage: Secondary | ICD-10-CM | POA: Diagnosis not present

## 2023-07-02 ENCOUNTER — Ambulatory Visit: Payer: Self-pay | Admitting: Physician Assistant

## 2023-07-09 ENCOUNTER — Other Ambulatory Visit: Payer: Self-pay | Admitting: Family Medicine

## 2023-07-09 DIAGNOSIS — Z1231 Encounter for screening mammogram for malignant neoplasm of breast: Secondary | ICD-10-CM

## 2023-07-17 ENCOUNTER — Ambulatory Visit: Payer: Self-pay

## 2023-07-17 ENCOUNTER — Ambulatory Visit: Admitting: Psychology

## 2023-07-17 DIAGNOSIS — R419 Unspecified symptoms and signs involving cognitive functions and awareness: Secondary | ICD-10-CM | POA: Diagnosis not present

## 2023-07-17 DIAGNOSIS — R4189 Other symptoms and signs involving cognitive functions and awareness: Secondary | ICD-10-CM

## 2023-07-17 NOTE — Progress Notes (Signed)
   Psychometrician Note   Cognitive testing was administered to Lori Rondo by Abbott Abbot, B.S. (psychometrist) under the supervision of Dr. Janice Meeter, Psy.D., licensed psychologist on 07/17/2023. Ms. Finks did not appear overtly distressed by the testing session per behavioral observation or responses across self-report questionnaires. Rest breaks were offered.    The battery of tests administered was selected by Dr. Janice Meeter, Psy.D. with consideration to Ms. Hailes's current level of functioning, the nature of her symptoms, emotional and behavioral responses during interview, level of literacy, observed level of motivation/effort, and the nature of the referral question. This battery was communicated to the psychometrist. Communication between Dr. Janice Meeter, Psy.D. and the psychometrist was ongoing throughout the evaluation and Dr. Janice Meeter, Psy.D. was immediately accessible at all times. Dr. Janice Meeter, Psy.D. provided supervision to the psychometrist on the date of this service to the extent necessary to assure the quality of all services provided.    Lori Rondo will return within approximately 1-2 weeks for an interactive feedback session with Dr. Donavon Fudge at which time her test performances, clinical impressions, and treatment recommendations will be reviewed in detail. Ms. Groesbeck understands she can contact our office should she require our assistance before this time.  A total of 110 minutes of billable time were spent face-to-face with Ms. Rissmiller by the psychometrist. This includes both test administration and scoring time. Billing for these services is reflected in the clinical report generated by Dr. Janice Meeter, Psy.D.  This note reflects time spent with the psychometrician and does not include test scores or any clinical interpretations made by Dr. Donavon Fudge. The full report will follow in a separate note.

## 2023-07-17 NOTE — Progress Notes (Signed)
 NEUROPSYCHOLOGICAL EVALUATION [place]. [facility]  [place] Department of Neurology  Date of Evaluation: 07/17/2023  REASON FOR REFERRAL   Christie Beasley is a 73 year old, right-handed, White female with 18 years of formal education. She was referred for neuropsychological evaluation by [name], PA-C, to assess current neurocognitive functioning, document potential cognitive deficits, and assist with treatment planning. This is her first neuropsychological evaluation.  SUMMARY OF RESULTS   Premorbid cognitive abilities are estimated to be in the average range based on word reading and sociodemographic factors. Consistent with this estimate, the patient demonstrated intact attention/working memory, processing speed, executive functioning, language, visuospatial abilities, and learning/memory. Several scores were not only within the expected range compared to a normative sample of her peers but, at times, exceeded expectations. On self-report questionnaires, she did not endorse clinically-elevated levels of depression, anxiety, or daytime sleepiness.  DIAGNOSTIC IMPRESSION   Results of the current evaluation indicated normal cognitive functioning across all domains. She does not show evidence of a neurocognitive disorder at this time. Her overall profile reflects healthy cognitive aging and well-preserved cognitive and functional abilities. Subjective cognitive concerns may be related to normal aging, mild cerebrovascular changes, and potential medication side effects (e.g., amitriptyline). Results serve as a strong baseline for future comparison, should it ever become necessary to re-evaluate the patient. Recommendations are listed below.  ICD-10 Codes: R41.9 Cognitive concerns with normal neuropsychological exam  RECOMMENDATIONS   In consultation with your doctor, schedule cognitive reevaluation on an as-needed basis to assess for cognitive decline and update treatment  recommendations. Reevaluation should occur during a period of medical and affective stability.  Patient was encouraged to consult her primary care provider regarding her current amitriptyline prescription, as she has been experiencing daytime grogginess and a sense of not feeling like herself since initiating the medication.  Continue to prioritize physical health through diet, exercise, and sleep: Regular physical activity supports cardiovascular health, improves mood, and helps preserve mobility and independence. Aim for at least 150 minutes of moderate aerobic exercise per week (e.g., brisk walking, swimming, gardening). A brain-healthy diet such as the Mediterranean or MIND diet is rich in fruits, vegetables, whole grains, healthy fats, and lean proteins, and has been associated with reduced risk of cognitive decline. Additionally, getting adequate, quality sleep and managing chronic conditions with the help of healthcare providers are essential components of healthy aging.  Continue to stay socially and mentally engaged. Maintaining strong social connections and regularly stimulating your brain can help protect against cognitive decline. This includes staying connected with friends and family, volunteering, or participating in community groups. Mentally engaging activities--such as reading, doing puzzles, playing strategy games, or learning a new language or musical instrument--promote brain plasticity. If you are interested in activities to support cognitive engagement, this site offers a variety of apps and games organized by difficulty level:  https://www.[facility].org/get-to-know-barrow/centers-programs/neurorehabilitation-center/neuro-rehab-apps-and-games/  Given ongoing sleep difficulties, she may benefit from the implementation of sleep hygiene techniques, including:  -Go to bed and get up at the same time each day to help your body establish a regular rhythm.  -Establish and maintain a  bedtime routine. Certain activities such as stretching, meditating, listening to soft music, or reading ~15 minutes before bedtime can be a great way to regularly get your brain and body ready for sleep.  -Avoid taking naps during the day.  -Avoid alcohol and caffeine for 5 or 6 hours before going to bed.  -Get regular exercise, but not in the hours before bedtime.  -Use comfortable  bedding and maintain a cool temperature in your bedroom  -Block out light and distracting noise.  -Avoid watching television or using your phone/computer in bed.  -Avoid staying in bed if you have difficulty falling asleep. If you have not been able to get to sleep after about 20 minutes or more, get up and do something calming or boring until you feel sleepy, then return to bed and try again.  Consider implementing compensatory strategies to maximize independence and maintain daily functioning. Examples include:   -Adhere to routine. Compensatory strategies work best when they are used consistently. Use a planner, calendar, or white board that has the schedule and important events for the day clearly listed to reference and cross off when tasks are complete.   -Ask for written information, especially if it is new or unfamiliar (e.g., information provided at a doctor's appointment).   -Create an organized environment. Keep items that can be easily misplaced in a sensible location and get into the habit of always returning the items to those places.   -Pay attention and reduce distractions. Make a point of focusing attention on information you want to remember. One-on-one interaction is more likely to facilitate attention and minimize distraction. Make eye contact and repeat the information out loud after you hear it. Reduce interruptions or distractions especially when attempting to learn new information.   -Create associations. When learning something new, think about and understand the information. Explain it  in your own words or try to associate it with something you already know. Take notes to help remember important details.  -Evaluate goals and plan accordingly. When confronted by many different tasks, begin by making a list that prioritizes each task and estimates the time it will take to complete. Break down complicated tasks into smaller, more manageable steps.   -Focus on one task at a time and complete each task before starting another. Avoid multitasking.  DISPOSITION   Patient will follow up with the referring provider, Ms. Wertman. No follow-up neuropsychological testing was scheduled at this time. Please feel free to refer the patient for repeated evaluation if she shows a significant change in neurocognitive status. She will be provided verbal feedback in approximately one week regarding the findings and impression during this visit.  The remainder of the report includes the details of the patient's background and a table of results from the current evaluation, which support the summary and recommendations described above.  BACKGROUND   History of Presenting Illness: The following information was obtained from a review of medical records and an interview with the patient. Patient established care with neurology on 06/05/2023 due to word- and name-finding difficulties that have developed over the past 3-4 years. She was uncertain whether these changes were normal for her age, so she sought evaluation. MoCA = 28/30.  Cognitive Functioning: During today's appointment, the patient reiterated concerns previously reported to her neurologist. Over the past few years, she has experienced difficulties with word-finding as well as with recalling names, particularly for people she does not interact with regularly--though she usually recognizes their faces, such as her neighbor. She largely denied short-term memory problems, aside from occasionally misplacing items or her daughter pointing out that she  is repeating herself. She also denied difficulties with comprehension, attention, processing speed, navigation, and executive functioning (e.g., planning, organizing).  Physical Functioning: Patient endorsed difficulties with both sleep initiation and maintenance. She was recently prescribed amitriptyline, which has improved her sleep; however, she reports experiencing daytime grogginess, leading her to  consider discontinuing the medication and discussing alternatives with her physician. Appetite is stable. No changes to sense of taste or smell were reported. Vision and hearing are stable. She reported having a lifelong history of fainting episodes, describing herself as a "fainter." She denied any significant head injuries resulting from these episodes. It has been hypothesized that these episodes may be related to low blood pressure. She has undergone surgery on both knees, which contributed to some balance difficulties; however, after completing physical therapy and continuing regular exercise, she has noticed improvement in her balance. She denied gait issues, falls, and tremors.  Emotional Functioning: Patient described herself as typically a happy person but noted feeling somewhat "off" since starting amitriptyline and wonders if discontinuing the medication would help her return to her usual self. She denied any suicidal ideation. 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  Imaging: MRI of the brain (06/17/2023) documented mild cerebral volume loss with mild chronic microvascular ischemic changes.  Other Relevant Medical History: Remarkable for hypercholesterolemia, hypothyroidism s/p partial thyroidectomy, arthritis, and gastroesophageal reflux disease. No history of stroke, CNS infection, head injury, or seizure was  reported.  Current Medications: Per record and with patient review, acetaminophen , amitriptyline, amoxicillin, celecoxib , coenzyme Q10, ezetimibe, fluticasone, multiple vitamins-minerals, omeprazole, Synthroid , turmeric, and vitamin D.  Functional Status: Patient independently performs all basic and instrumental activities (i.e., driving, medications, finances, appointments) of daily living without difficulty.  Family Neurological History: Remarkable for unspecified dementia (mother).  Psychiatric History: History of depression, anxiety, prior mental health treatment, suicidal ideation, hallucinations, and psychiatric hospitalizations was not reported.  Substance Use History: Patient reported infrequent alcohol consumption. Current use of nicotine, marijuana, and illicit substances was denied.  Social and Developmental History: Patient was born in [place], [place]. History of perinatal complications and developmental delays was not reported. She has been divorced twice in the past and is currently in a relationship. She has two children. She lives alone in a townhome community.  Educational and Occupational History: No history of childhood learning disability, special education services, or grade retention was reported. Patient reported feeling somewhat academically behind during her early school years, attributing this to having started school at a younger age due to a November birthdate. Despite this, she described herself as generally performing at an average academic level. She earned a master's degree in middle grades education with a concentration in mathematics. She is retired.  BEHAVIORAL OBSERVATIONS   Patient arrived on time and was unaccompanied. She ambulated independently and without gait disturbance. She was alert and fully oriented. She was appropriately groomed and dressed for the setting. No significant sensory or motor abnormalities were observed. Vision and hearing were adequate  for testing purposes. Speech was of normal rate, prosody, and volume. No conversational word-finding difficulties, paraphasic errors, or dysarthria were observed. Comprehension was conversationally intact. Thought processes were linear, logical, and coherent. Thought content was organized and devoid of delusions. Insight appeared intact. Affect was even and congruent with euthymic mood. She was cooperative and gave adequate effort during testing, including on standalone and embedded measures of performance validity. Results are thought to accurately reflect her cognitive functioning at this time.  NEUROPSYCHOLOGICAL TESTING RESULTS   Tests Administered: Animal Naming Test; Brief Visuospatial Memory Test-Revised (BVMT-R) - Form 1; Controlled Oral Word Association Test (COWAT): FAS; Delis-Kaplan Executive Function System (D-KEFS) - Subtest(s):  Color-Word Interference Test; Epworth Sleepiness Scale (ESS); Geriatric Anxiety Scale-10 Item (GAS-10); Geriatric Depression Scale Short Form (GDS-SF); Grooved Pegboard Test; [employer] Verbal Learning Test Revised (HVLT-R) - From 1; Judgment of Line Orientation (JLO) - Form v; Neuropsychological Assessment Battery (NAB) - Subtest(s): Naming Form 1; Standalone performance validity test (PVT); Test of Premorbid Functioning (TOPF); Trail Making Test (TMT); Wechsler Adult Intelligence Scale Fifth Edition (WAIS-5) - Subtest(s): Similarities, Clinical cytogeneticist, Matrix Reasoning, Digit Sequencing, Coding, Running Digits, Symbol Search, Symbol Span; and Wechsler Memory Scale Fourth Edition (WMS-IV) - Subtest(s): Logical Memory (LM).  Test results are provided in the table below. Whenever possible, the patient's scores were compared against age-, sex-, and education-corrected normative samples. Interpretive descriptions are based on the AACN consensus conference statement on uniform labeling (Guilmette et al., 2020).  PREMORBID FUNCTIONING RAW  RANGE  TOPF 46 StdS=103 Average   ATTENTION & WORKING MEMORY RAW  RANGE  WAIS-5 Digit Sequencing -- ss=12 High Average  WAIS-5 Running Digits -- ss=16 Exceptionally High  WAIS-5 Symbol Span -- ss=12 High Average  PROCESSING SPEED RAW  RANGE  Trails A 26''1e T=55 Average  WAIS-5 Coding  -- ss=13 High Average  WAIS-5 Symbol Search -- ss=10 Average  DKEFS CWIT Color Naming 23''0e ss=14 High Average  DKEFS CWIT Word Reading 20''0e ss=13 High Average  EXECUTIVE FUNCTION RAW  RANGE  Trails B 45''0e T=65 Above Average  WAIS-5 Matrix Reasoning -- ss=11 Average  WAIS-5 Similarities -- ss=11 Average  COWAT Letter Fluency 16+14+14 T=48 Average  DKEFS CWIT Inhibition 76''0e ss=10 Average  DKEFS CWIT Inhibition/Switching 60''0e ss=13 High Average  LANGUAGE RAW  RANGE  COWAT Letter Fluency 16+14+14 T=48 Average  Animal Naming Test 21 T=52 Average  NAB Naming Test 31/31 T=58 WNL  VISUOSPATIAL RAW    WAIS-5 Block Design -- ss=12 High Average  JLO C/S=23/30 40%ile Average  BVMT-R Copy Trial 12/12 -- WNL  VERBAL LEARNING & MEMORY RAW  RANGE  HVLT Learning Trials (7+9+9)/36 T=54 Average  HVLT Delayed Recall 8/12 T=49 Average  HVLT Recognition Hits 12 -- --  HVLT Recognition False Positives 2 -- --  HVLT Discrimination Index 10 T=48 Average  WMS-IV LM-I  (10+14+14)/53 ss=13 High Average  WMS-IV LM-II  (12+11)/39 ss=12 High Average  WMS-IV LM Recognition  (8+12)/23 >75%ile High Average to Exceptionally High  VISUAL LEARNING & MEMORY RAW  RANGE  BVMT-R Total Recall (6+5+9)/36 T=50 Average  BVMT-R Delayed Recall 8/12 T=51 Average  BVMT-R Percent Retained 89 >16%ile WNL  BVMT-R Recognition Hits 6 >16%ile WNL  BVMT-R Recognition False Alarms 0 >16%ile WNL  BVMT-R Recognition Discrimination Index 6 >16%ile WNL  FINE MOTOR DEXTERITY RAW  RANGE  Grooved Pegboard (Dominant Hand) 97''0d T=37 Low Average  Grooved Pegboard (Non-Dominant Hand) 93''0d T=43 Average  QUESTIONNAIRES RAW  RANGE  GDS-SF 0 -- Minimal  GAS-10 3 -- Minimal   ESS 7 -- WNL  *Note: ss = scaled score; StdS = standard score; T = t-score; C/S = corrected raw score; WNL = within normal limits; BNL= below normal limits; D/C = discontinued. Scores from skewed distributions are typically interpreted as WNL (>=16th %ile) or BNL (<16th %ile).   INFORMED CONSENT   Patient was provided with a verbal description of the nature and purpose of the neuropsychological evaluation. Also reviewed were the foreseeable risks and/or discomforts and benefits of the procedure, limits of confidentiality, and mandatory reporting requirements of this provider. Patient was given the opportunity to have their questions answered. Oral consent to participate was provided by  the patient.   This report was prepared as part of a clinical evaluation and is not intended for forensic use.  SERVICE   This evaluation was conducted by Janice Meeter, Psy.D. In addition to time spent directly with the patient, total professional time (120 minutes) includes record review, integration of relevant medical history, test selection, interpretation of findings, and report preparation. A technician, Abbott Abbot, B.S., provided testing and scoring assistance for 110 minutes.  Psychiatric Diagnostic Evaluation Services (Professional): 56213 x 1 Neuropsychological Testing Evaluation Services (Professional): 08657 x 1 Neuropsychological Testing Evaluation Services (Professional): 84696 x 1 Neuropsychological Test Administration and Scoring Radiographer, therapeutic): [phone] x 1 Neuropsychological Test Administration and Scoring (Technician): [phone] x 3  This report was generated using voice recognition software. While this document has been carefully reviewed, transcription errors may be present. I apologize in advance for any inconvenience. Please contact me if further clarification is needed.            Janice Meeter, Psy.D.             Neuropsychologist

## 2023-07-22 ENCOUNTER — Ambulatory Visit: Admitting: Physician Assistant

## 2023-07-24 ENCOUNTER — Ambulatory Visit: Admitting: Psychology

## 2023-07-24 DIAGNOSIS — R419 Unspecified symptoms and signs involving cognitive functions and awareness: Secondary | ICD-10-CM | POA: Diagnosis not present

## 2023-07-24 NOTE — Progress Notes (Signed)
   NEUROPSYCHOLOGY FEEDBACK SESSION [place]. [facility]  [place] Department of Neurology  Date of Feedback Session: 07/24/2023  REASON FOR REFERRAL   Christie Beasley is a 73 year old, right-handed, White female with 18 years of formal education. She was referred for neuropsychological evaluation by [name], PA-C, to assess current neurocognitive functioning, document potential cognitive deficits, and assist with treatment planning. This is her first neuropsychological evaluation.  FEEDBACK   Patient completed a comprehensive neuropsychological evaluation on 07/17/2023. Please refer to that encounter for the full report and recommendations. Briefly, results indicated normal cognitive functioning across all domains. She does not show evidence of a neurocognitive disorder at this time. Her overall profile reflects healthy cognitive aging and well-preserved cognitive and functional abilities. Subjective cognitive concerns may be related to normal aging, mild cerebrovascular changes, and potential medication side effects (e.g., amitriptyline).   Today, the patient was unaccompanied. She was provided verbal feedback regarding the findings and impression during this visit, and her questions were answered. A copy of the report was provided at the conclusion of the visit.  DISPOSITION   Patient will follow up with the referring provider, Ms. Wertman. Cognitive re-evaluation does not appear to be necessary at this time. Providers wishing to update treatment recommendations are encouraged to refer as appropriate.  SERVICE   This feedback session was conducted by Janice Meeter, Psy.D. [place] unit [place] [id number] (35 minutes) was billed for Dr. Donavon Fudge' time spent in preparing, conducting, and documenting the current feedback session.  This report was generated using voice recognition software. While this document has been carefully reviewed, transcription errors may be present. I apologize in  advance for any inconvenience. Please contact me if further clarification is needed.

## 2023-07-29 ENCOUNTER — Ambulatory Visit: Admitting: Physician Assistant

## 2023-08-21 ENCOUNTER — Other Ambulatory Visit (HOSPITAL_BASED_OUTPATIENT_CLINIC_OR_DEPARTMENT_OTHER): Payer: Self-pay

## 2023-09-04 ENCOUNTER — Ambulatory Visit

## 2023-09-05 ENCOUNTER — Ambulatory Visit
Admission: RE | Admit: 2023-09-05 | Discharge: 2023-09-05 | Disposition: A | Discharge status: Home / Self Care | Source: Ambulatory Visit | Attending: Family Medicine | Admitting: Family Medicine

## 2023-09-05 DIAGNOSIS — Z1231 Encounter for screening mammogram for malignant neoplasm of breast: Secondary | ICD-10-CM

## 2023-09-08 ENCOUNTER — Encounter (HOSPITAL_COMMUNITY): Payer: Self-pay | Admitting: *Deleted

## 2023-09-08 ENCOUNTER — Ambulatory Visit (HOSPITAL_COMMUNITY)
Admission: EM | Admit: 2023-09-08 | Discharge: 2023-09-08 | Disposition: A | Discharge status: Home / Self Care | Attending: Emergency Medicine | Admitting: Emergency Medicine

## 2023-09-08 ENCOUNTER — Other Ambulatory Visit: Payer: Self-pay

## 2023-09-08 DIAGNOSIS — S0502XA Injury of conjunctiva and corneal abrasion without foreign body, left eye, initial encounter: Secondary | ICD-10-CM | POA: Diagnosis not present

## 2023-09-08 MED ORDER — TETRACAINE HCL 0.5 % OP SOLN
OPHTHALMIC | Status: AC
  Filled 2023-09-08: qty 4

## 2023-09-08 MED ORDER — POLYMYXIN B-TRIMETHOPRIM 10000-0.1 UNIT/ML-% OP SOLN: 1.0000 [drp] | Freq: Four times a day (QID) | OPHTHALMIC | 0 refills | Status: AC

## 2023-09-08 NOTE — ED Triage Notes (Signed)
 PT reports she could not find her contact over the wekend. Pt reports she tried multiple times checking her eye for contact. Pt's lt eyei s red .

## 2023-09-08 NOTE — ED Provider Notes (Signed)
 [facility]    CSN: [id number] Arrival date & time: 09/08/23  1503      History   Chief Complaint Chief Complaint  Patient presents with   Eye Problem    HPI Christie Beasley is a 73 y.o. female.   Patient presents to clinic for concern of left eye irritation.  She does wear bilateral contacts and over the weekend she cannot find the contact in her left eye.  She tried multiple times to find it.  She has been waking up the past few days with her eyelid of the left eye crusted shut with some purulent discharge.  Does have conjunctival injection.  Feels a gritty sensation in the eye, was not able to find her contact.  Denies any acute vision changes, is nearsighted in the left eye without her contacts.  Denies eye pain.  The history is provided by the patient and medical records.  Eye Problem   Past Medical History:  Diagnosis Date   Acid reflux    Arthritis    Elevated lipids    Fibroid    History of fainting    pt reports she is a fainter.     Hyperlipidemia    PONV (postoperative nausea and vomiting)    Rosacea    Thrombophlebitis 11/2000   Thyroid  nodule 09/2002    Patient Active Problem List   Diagnosis Date Noted   Memory impairment 06/05/2023   S/P total knee arthroplasty, right 11/27/2022   Educated about COVID-19 virus infection 07/07/2019   Palpitations 07/07/2019   Pain in right knee 07/11/2017   History of prosthetic unicompartmental arthroplasty of left knee 03/10/2015   Post-menopausal bleeding 11/12/2012   Vaginal atrophy 11/12/2012    Past Surgical History:  Procedure Laterality Date   BREAST EXCISIONAL BIOPSY Left    BREAST EXCISIONAL BIOPSY Right    CESAREAN SECTION     x 2   DILATION AND CURETTAGE OF UTERUS  1994   FOOT SURGERY Bilateral    hammer toes   HYSTEROSCOPY  01/11/2012   D&C   KNEE SURGERY Left 2012   MEDIAL PARTIAL KNEE REPLACEMENT Left 03/10/2015   THYROID  LOBECTOMY Right 02/2003   TONSILLECTOMY AND  ADENOIDECTOMY     TOTAL KNEE ARTHROPLASTY Right 11/27/2022   Procedure: RIGHT TOTAL KNEE ARTHROPLASTY;  Surgeon: Ernie Cough, MD;  Location: [facility];  Service: Orthopedics;  Laterality: Right;    OB History     Gravida  3   Para  2   Term  2   Preterm      AB  1   Living  2      SAB      IAB      Ectopic      Multiple      Live Births               Home Medications    Prior to Admission medications   Medication Sig Start Date End Date Taking? Authorizing Provider  acetaminophen  (TYLENOL ) 650 MG CR tablet Take 1,300 mg by mouth every 8 (eight) hours as needed for pain.   Yes [provider]  Coenzyme Q10 (COQ10) 100 MG CAPS Take 100 mg by mouth daily.   Yes [provider]  ezetimibe (ZETIA) 10 MG tablet Take 10 mg by mouth daily.   Yes [provider]  fluticasone (FLONASE) 50 MCG/ACT nasal spray Place into both nostrils daily.   Yes [provider]  Multiple Vitamins-Minerals (MULTIVITAMIN  PO) Take 1 tablet by mouth daily. Taking by mouth at bedtime.   Yes [provider]  omeprazole (PRILOSEC) 40 MG capsule Take 40 mg by mouth daily. 04/28/14  Yes [provider]  SYNTHROID  75 MCG tablet Take 75 mcg by mouth daily before breakfast. 10/24/12  Yes [provider]  trimethoprim -polymyxin b  (POLYTRIM ) ophthalmic solution Place 1 drop into the left eye in the morning, at noon, in the evening, and at bedtime for 5 days. 09/08/23 09/13/23 Yes Chellie Vanlue  N, FNP  Vitamin D, Ergocalciferol, (DRISDOL) 50000 units CAPS capsule Take by mouth. Every 3 weeks 1.25mg  04/02/16  Yes [provider]  amitriptyline (ELAVIL) 10 MG tablet Take 10 mg by mouth at bedtime.    [provider]  amoxicillin (AMOXIL) 500 MG capsule Take 500 mg by mouth as needed. One hour prior to dental work Patient not taking: Reported on 06/05/2023    [provider]  celecoxib  (CELEBREX ) 200 MG capsule Take 200 mg by  mouth every other day. 04/16/16   [provider]  COVID-19 mRNA bivalent vaccine, Moderna, (MODERNA COVID-19 BIVAL BOOSTER) 50 MCG/0.5ML injection Inject into the muscle. Patient not taking: Reported on 06/05/2023 12/05/20   Luiz Channel, MD  COVID-19 mRNA vaccine [phone] (COMIRNATY ) syringe Inject into the muscle. Patient not taking: Reported on 06/05/2023 02/05/22   Luiz Channel, MD  TURMERIC PO Take 2,250 mg by mouth daily. + curcumin    [provider]    Family History Family History  Problem Relation Age of Onset   Cancer Father        Esophageal   Heart disease Mother        Angina    Social History Social History   Tobacco Use   Smoking status: Former   Smokeless tobacco: Never  Vaping Use   Vaping status: Never Used  Substance Use Topics   Alcohol use: Yes    Alcohol/week: 1.0 - 2.0 standard drink of alcohol    Types: 1 - 2 Standard drinks or equivalent per week    Comment: occas   Drug use: No     Allergies   Lipitor  [atorvastatin calcium]   Review of Systems Review of Systems  Per HPI  Physical Exam Triage Vital Signs ED Triage Vitals  Encounter Vitals Group     BP 09/08/23 1610 (!) 157/93     Girls Systolic BP Percentile --      Girls Diastolic BP Percentile --      Boys Systolic BP Percentile --      Boys Diastolic BP Percentile --      Pulse Rate 09/08/23 1610 67     Resp 09/08/23 1610 18     Temp 09/08/23 1610 98.3 F (36.8 C)     Temp src --      SpO2 09/08/23 1610 95 %     Weight --      Height --      Head Circumference --      Peak Flow --      Pain Score 09/08/23 1608 0     Pain Loc --      Pain Education --      Exclude from Growth Chart --    No data found.  Updated Vital Signs BP (!) 157/93   Pulse 67   Temp 98.3 F (36.8 C)   Resp 18   LMP 02/19/2001   SpO2 95%   Visual Acuity Right Eye Distance: 20/30 with  correction Left Eye Distance: 20/40 Bilateral Distance: 20/30 with  correction  Right Eye Near:   Left Eye Near:    Bilateral Near:     Physical Exam Vitals and nursing note reviewed.  Constitutional:      Appearance: Normal appearance.  HENT:     Head: Normocephalic and atraumatic.     Right Ear: External ear normal.     Left Ear: External ear normal.     Nose: Nose normal.     Mouth/Throat:     Mouth: Mucous membranes are moist.  Eyes:     General: Lids are normal. Lids are everted, no foreign bodies appreciated. Vision grossly intact. Gaze aligned appropriately. No scleral icterus.    Extraocular Movements: Extraocular movements intact.     Conjunctiva/sclera:     Left eye: Left conjunctiva is injected.     Pupils: Pupils are equal, round, and reactive to light.      Comments: Corneal abrasion to the left eye on fluorescein stain.  Unable to visualize any retained foreign body or contact.  Cardiovascular:     Rate and Rhythm: Normal rate.  Pulmonary:     Effort: Pulmonary effort is normal. No respiratory distress.  Musculoskeletal:        General: Normal range of motion.  Skin:    General: Skin is warm and dry.  Neurological:     General: No focal deficit present.     Mental Status: She is alert and oriented to person, place, and time.  Psychiatric:        Mood and Affect: Mood normal.        Behavior: Behavior normal. Behavior is cooperative.      UC Treatments / Results  Labs (all labs ordered are listed, but only abnormal results are displayed) Labs Reviewed - No data to display  EKG   Radiology No results found.  Procedures Procedures (including critical care time)  Medications Ordered in UC Medications - No data to display  Initial Impression / Assessment and Plan / UC Course  I have reviewed the triage vital signs and the nursing notes.  Pertinent labs & imaging results that were available during my care of the patient were reviewed by me and considered in my medical decision making (see chart for  details).  Vitals and triage reviewed, patient is hemodynamically stable.  PERRLA.  Left conjunctive with injection.  Fluorescein stain revealing corneal abrasion of the left outer conjunctiva.  No visualized retained foreign body.  Will place on Polytrim  for coverage of bacterial conjunctivitis.  Optho follow-up encouraged if symptoms evolve.  Plan of care, follow-up care return precautions given, no questions at this time.    Final Clinical Impressions(s) / UC Diagnoses   Final diagnoses:  Abrasion of left cornea, initial encounter     Discharge Instructions      Use the Polytrim  4 times daily for the next 5 days for your left eye.  Wash your hands before and after eyedrop application.  In the morning you can clear any crusting drainage with a warm wash rag, using a clean rag each time.  Wait to put in your contact on your left eye until the eye is completely healed.  Follow-up with the ophthalmologist if your symptoms do not improve or if they change in any way.     ED Prescriptions     Medication Sig Dispense Auth. Provider   trimethoprim -polymyxin b  (POLYTRIM ) ophthalmic solution Place 1 drop into the left eye in the  morning, at noon, in the evening, and at bedtime for 5 days. 10 mL Dreama, Katleen Carraway  N, FNP      PDMP not reviewed this encounter.   Dreama, Zaina Jenkin  N, FNP 09/08/23 [phone]

## 2023-09-08 NOTE — Discharge Instructions (Addendum)
 Use the Polytrim  4 times daily for the next 5 days for your left eye.  Wash your hands before and after eyedrop application.  In the morning you can clear any crusting drainage with a warm wash rag, using a clean rag each time.  Wait to put in your contact on your left eye until the eye is completely healed.  Follow-up with the ophthalmologist if your symptoms do not improve or if they change in any way.

## 2023-11-04 ENCOUNTER — Other Ambulatory Visit (HOSPITAL_BASED_OUTPATIENT_CLINIC_OR_DEPARTMENT_OTHER): Payer: Self-pay

## 2023-11-04 MED ORDER — FLUZONE HIGH-DOSE 0.5 ML IM SUSY
0.5000 mL | PREFILLED_SYRINGE | Freq: Once | INTRAMUSCULAR | 0 refills | Status: AC
  Filled 2023-11-04: qty 0.5, 1d supply, fill #0

## 2023-11-11 ENCOUNTER — Other Ambulatory Visit (HOSPITAL_BASED_OUTPATIENT_CLINIC_OR_DEPARTMENT_OTHER): Payer: Self-pay

## 2023-11-11 MED ORDER — COMIRNATY 30 MCG/0.3ML IM SUSY
0.3000 mL | PREFILLED_SYRINGE | Freq: Once | INTRAMUSCULAR | 0 refills | Status: AC
  Filled 2023-11-11: qty 0.3, 1d supply, fill #0

## 2024-02-26 ENCOUNTER — Other Ambulatory Visit (HOSPITAL_BASED_OUTPATIENT_CLINIC_OR_DEPARTMENT_OTHER): Payer: Self-pay

## 2024-02-26 MED ORDER — LEVOTHYROXINE SODIUM 75 MCG PO TABS
75.0000 ug | ORAL_TABLET | Freq: Every morning | ORAL | 3 refills | Status: AC
  Filled 2024-02-26: qty 90, 90d supply, fill #0

## 2024-02-26 MED ORDER — VITAMIN D (ERGOCALCIFEROL) 1.25 MG (50000 UNIT) PO CAPS
50000.0000 [IU] | ORAL_CAPSULE | ORAL | 4 refills | Status: AC
  Filled 2024-02-26: qty 6, 84d supply, fill #0

## 2024-02-26 MED ORDER — EZETIMIBE 10 MG PO TABS
10.0000 mg | ORAL_TABLET | Freq: Every day | ORAL | 3 refills | Status: AC
  Filled 2024-02-26: qty 90, 90d supply, fill #0

## 2024-02-28 ENCOUNTER — Other Ambulatory Visit (HOSPITAL_BASED_OUTPATIENT_CLINIC_OR_DEPARTMENT_OTHER): Payer: Self-pay

## 2024-03-04 ENCOUNTER — Other Ambulatory Visit (HOSPITAL_BASED_OUTPATIENT_CLINIC_OR_DEPARTMENT_OTHER): Payer: Self-pay | Admitting: Family Medicine

## 2024-03-04 DIAGNOSIS — M810 Age-related osteoporosis without current pathological fracture: Secondary | ICD-10-CM
